# Patient Record
Sex: Female | Born: 1970 | Race: White | Hispanic: No | Marital: Married | Smoking: Former smoker
Health system: Southern US, Community
[De-identification: ages and names within clinical notes are randomized; demographics above are authoritative.]

## PROBLEM LIST (undated history)

## (undated) DIAGNOSIS — G4730 Sleep apnea, unspecified: Secondary | ICD-10-CM

## (undated) DIAGNOSIS — Z8742 Personal history of other diseases of the female genital tract: Secondary | ICD-10-CM

## (undated) DIAGNOSIS — Z8619 Personal history of other infectious and parasitic diseases: Secondary | ICD-10-CM

## (undated) DIAGNOSIS — G40909 Epilepsy, unspecified, not intractable, without status epilepticus: Secondary | ICD-10-CM

## (undated) DIAGNOSIS — G40209 Localization-related (focal) (partial) symptomatic epilepsy and epileptic syndromes with complex partial seizures, not intractable, without status epilepticus: Secondary | ICD-10-CM

## (undated) HISTORY — PX: ABDOMINAL HYSTERECTOMY: SHX81

---

## 2005-07-12 ENCOUNTER — Emergency Department: Payer: Self-pay | Admitting: Emergency Medicine

## 2005-07-13 ENCOUNTER — Ambulatory Visit: Payer: Self-pay | Admitting: Emergency Medicine

## 2005-07-21 ENCOUNTER — Ambulatory Visit: Payer: Self-pay | Admitting: Internal Medicine

## 2005-07-25 ENCOUNTER — Ambulatory Visit: Payer: Self-pay | Admitting: Internal Medicine

## 2006-04-30 HISTORY — PX: ABDOMINAL HYSTERECTOMY: SHX81

## 2007-04-09 ENCOUNTER — Ambulatory Visit: Payer: Self-pay | Admitting: Family Medicine

## 2007-08-05 ENCOUNTER — Ambulatory Visit: Payer: Self-pay | Admitting: Family Medicine

## 2008-02-26 ENCOUNTER — Ambulatory Visit: Payer: Self-pay | Admitting: Obstetrics and Gynecology

## 2008-08-31 ENCOUNTER — Ambulatory Visit: Payer: Self-pay | Admitting: Obstetrics and Gynecology

## 2008-09-06 ENCOUNTER — Ambulatory Visit: Payer: Self-pay | Admitting: Obstetrics and Gynecology

## 2009-03-03 ENCOUNTER — Ambulatory Visit: Payer: Self-pay | Admitting: Obstetrics and Gynecology

## 2010-04-20 ENCOUNTER — Ambulatory Visit: Payer: Self-pay | Admitting: Obstetrics and Gynecology

## 2010-10-12 ENCOUNTER — Ambulatory Visit: Payer: Self-pay | Admitting: Otolaryngology

## 2011-04-26 ENCOUNTER — Ambulatory Visit: Payer: Self-pay | Admitting: Obstetrics and Gynecology

## 2012-04-28 ENCOUNTER — Ambulatory Visit: Payer: Self-pay | Admitting: Obstetrics and Gynecology

## 2013-04-29 ENCOUNTER — Ambulatory Visit: Payer: Self-pay | Admitting: Obstetrics and Gynecology

## 2013-06-20 ENCOUNTER — Ambulatory Visit: Payer: Self-pay | Admitting: Physician Assistant

## 2013-06-20 LAB — RAPID INFLUENZA A&B ANTIGENS

## 2013-06-20 LAB — RAPID STREP-A WITH REFLX: Micro Text Report: NEGATIVE

## 2013-06-22 LAB — BETA STREP CULTURE(ARMC)

## 2014-03-06 ENCOUNTER — Ambulatory Visit: Payer: Self-pay | Admitting: Physician Assistant

## 2014-03-06 LAB — URINALYSIS, COMPLETE
Bilirubin,UR: NEGATIVE
Glucose,UR: NEGATIVE
NITRITE: POSITIVE
Ph: 6.5 (ref 5.0–8.0)
Protein: 30
RBC,UR: >30 /HPF (ref 0–5)
Specific Gravity: 1.02 (ref 1.000–1.030)

## 2014-03-08 LAB — URINE CULTURE

## 2014-03-13 ENCOUNTER — Ambulatory Visit: Payer: Self-pay | Admitting: Emergency Medicine

## 2014-03-13 LAB — URINALYSIS, COMPLETE
BILIRUBIN, UR: NEGATIVE
GLUCOSE, UR: NEGATIVE
Ketone: NEGATIVE
Nitrite: NEGATIVE
Ph: 7 (ref 5.0–8.0)
Protein: NEGATIVE
Specific Gravity: 1.01 (ref 1.000–1.030)
Squamous Epithelial: NONE SEEN

## 2014-03-15 LAB — URINE CULTURE

## 2014-04-12 ENCOUNTER — Ambulatory Visit: Payer: Self-pay | Admitting: Family Medicine

## 2014-11-30 ENCOUNTER — Other Ambulatory Visit: Payer: Self-pay | Admitting: Obstetrics and Gynecology

## 2014-11-30 DIAGNOSIS — Z1231 Encounter for screening mammogram for malignant neoplasm of breast: Secondary | ICD-10-CM

## 2014-12-16 ENCOUNTER — Other Ambulatory Visit: Payer: Self-pay | Admitting: Obstetrics and Gynecology

## 2014-12-28 ENCOUNTER — Telehealth: Payer: Self-pay

## 2014-12-28 DIAGNOSIS — N390 Urinary tract infection, site not specified: Secondary | ICD-10-CM

## 2014-12-28 NOTE — Telephone Encounter (Signed)
Pt pharmacy sent a refill request for nitrofurantion. Spoke with pt who stated she was with the understanding that she didn't need an appt for a year but could call for refills. Pt was put on medication for prophylaxis. Please advise.

## 2014-12-29 NOTE — Telephone Encounter (Signed)
It's okay to refill medication. She just needs to be seen once yearly or as needed for acute infections. Please send it in. Thank you

## 2014-12-30 MED ORDER — NITROFURANTOIN MONOHYD MACRO 100 MG PO CAPS: 100.0000 mg | ORAL_CAPSULE | Freq: Two times a day (BID) | ORAL | Status: DC

## 2014-12-30 NOTE — Telephone Encounter (Signed)
Medication was sent to pharmacy.

## 2015-01-27 ENCOUNTER — Encounter: Payer: Self-pay | Admitting: Emergency Medicine

## 2015-01-27 ENCOUNTER — Ambulatory Visit
Admission: EM | Admit: 2015-01-27 | Discharge: 2015-01-27 | Disposition: A | Discharge status: Home / Self Care | Payer: BLUE CROSS/BLUE SHIELD | Attending: Family Medicine | Admitting: Family Medicine

## 2015-01-27 DIAGNOSIS — J029 Acute pharyngitis, unspecified: Secondary | ICD-10-CM

## 2015-01-27 HISTORY — DX: Epilepsy, unspecified, not intractable, without status epilepticus: G40.909

## 2015-01-27 LAB — RAPID STREP SCREEN (MED CTR MEBANE ONLY): Streptococcus, Group A Screen (Direct): NEGATIVE

## 2015-01-27 MED ORDER — AZITHROMYCIN 250 MG PO TABS: ORAL_TABLET | ORAL | Status: AC

## 2015-01-27 MED ORDER — FEXOFENADINE-PSEUDOEPHED ER 180-240 MG PO TB24: 1.0000 | ORAL_TABLET | Freq: Every day | ORAL | Status: DC

## 2015-01-27 NOTE — Discharge Instructions (Signed)
Pharyngitis Pharyngitis is a sore throat (pharynx). There is redness, pain, and swelling of your throat. HOME CARE   Drink enough fluids to keep your pee (urine) clear or pale yellow.  Only take medicine as told by your doctor.  You may get sick again if you do not take medicine as told. Finish your medicines, even if you start to feel better.  Do not take aspirin.  Rest.  Rinse your mouth (gargle) with salt water ( tsp of salt per 1 qt of water) every 1-2 hours. This will help the pain.  If you are not at risk for choking, you can suck on hard candy or sore throat lozenges. GET HELP IF:  You have large, tender lumps on your neck.  You have a rash.  You cough up green, yellow-brown, or bloody spit. GET HELP RIGHT AWAY IF:   You have a stiff neck.  You drool or cannot swallow liquids.  You throw up (vomit) or are not able to keep medicine or liquids down.  You have very bad pain that does not go away with medicine.  You have problems breathing (not from a stuffy nose). MAKE SURE YOU:   Understand these instructions.  Will watch your condition.  Will get help right away if you are not doing well or get worse. Document Released: 10/03/2007 Document Revised: 02/04/2013 Document Reviewed: 12/22/2012 [facility] Patient Information 2015 [place], [place]. This information is not intended to replace advice given to you by your health care provider. Make sure you discuss any questions you have with your health care provider.  Salt Water Gargle This solution will help make your mouth and throat feel better. HOME CARE INSTRUCTIONS   Mix 1 teaspoon of salt in 8 ounces of warm water.  Gargle with this solution as much or often as you need or as directed. Swish and gargle gently if you have any sores or wounds in your mouth.  Do not swallow this mixture. Document Released: 01/19/2004 Document Revised: 07/09/2011 Document Reviewed: 06/11/2008 [facility] Patient Information 2015  [place], [place]. This information is not intended to replace advice given to you by your health care provider. Make sure you discuss any questions you have with your health care provider.

## 2015-01-27 NOTE — ED Notes (Signed)
Patient c/o sore throat for 3-4 days.  Patient reports stuffy nose and sinus congestion.

## 2015-01-27 NOTE — ED Provider Notes (Signed)
CSN: [id number]     Arrival date & time 01/27/15  7741 History   First MD Initiated Contact with Patient 01/27/15 [phone]     Chief Complaint  Patient presents with  . Sore Throat   (Consider location/radiation/quality/duration/timing/severity/associated sxs/prior Treatment) Patient is a 44 y.o. female presenting with pharyngitis. No language interpreter was used.  Sore Throat This is a new problem. The current episode started more than 2 days ago (Started on Monday). The problem occurs daily. The problem has been gradually worsening. Pertinent negatives include no chest pain, no abdominal pain, no headaches and no shortness of breath. The symptoms are aggravated by eating. Nothing relieves the symptoms. She has tried nothing for the symptoms.    Past Medical History  Diagnosis Date  . Epilepsy    Past Surgical History  Procedure Laterality Date  . Abdominal hysterectomy     Family History  Problem Relation Age of Onset  . Diabetes Mother   . Cancer Father    Social History  Substance Use Topics  . Smoking status: Former Research scientist (life sciences)  . Smokeless tobacco: Never Used  . Alcohol Use: Yes   OB History    No data available     Review of Systems  HENT: Positive for congestion and rhinorrhea. Negative for ear pain, facial swelling, hearing loss and sinus pressure.   Eyes: Negative for discharge.  Respiratory: Negative for cough, shortness of breath and wheezing.   Cardiovascular: Negative for chest pain and leg swelling.  Gastrointestinal: Negative for abdominal pain.  Neurological: Negative for headaches.  All other systems reviewed and are negative.   Allergies  Sulfa antibiotics  Home Medications   Prior to Admission medications   Medication Sig Start Date End Date Taking? Authorizing Provider  carbamazepine (CARBATROL) 300 MG 12 hr capsule Take 300 mg by mouth 2 (two) times daily.   Yes Historical Provider, MD  lamoTRIgine (LAMICTAL) 200 MG tablet Take 200 mg by mouth 2  (two) times daily.   Yes Historical Provider, MD  levETIRAcetam (KEPPRA) 750 MG tablet Take 3,000 mg by mouth at bedtime.   Yes Historical Provider, MD  Multiple Vitamin (MULTIVITAMIN) tablet Take 1 tablet by mouth daily.   Yes Historical Provider, MD  azithromycin (ZITHROMAX Z-PAK) 250 MG tablet Take 2 tablets first day and then 1 po a day for 4 days. May fill on Saturday, October 1 if not improving. 01/29/15 01/20/16  Frederich Cha, MD  fexofenadine-pseudoephedrine (ALLEGRA-D ALLERGY & CONGESTION) 180-240 MG 24 hr tablet Take 1 tablet by mouth daily. 01/27/15   Frederich Cha, MD  nitrofurantoin (MACRODANTIN) 100 MG capsule TAKE ONE CAPSULE BY MOUTH AS DIRECTED 01/11/15   Roda Shutters, FNP  nitrofurantoin, macrocrystal-monohydrate, (MACROBID) 100 MG capsule Take 1 capsule (100 mg total) by mouth every 12 (twelve) hours. 12/30/14   Roda Shutters, FNP   Meds Ordered and Administered this Visit  Medications - No data to display  BP 113/77 mmHg  Pulse 78  Temp(Src) 98.6 F (37 C) (Tympanic)  Resp 16  Ht 5\' 4"  (1.626 m)  Wt 145 lb (65.772 kg)  BMI 24.88 kg/m2  SpO2 100% No data found.   Physical Exam  Constitutional: She is oriented to person, place, and time. She appears well-developed and well-nourished.  HENT:  Head: Normocephalic and atraumatic.  Right Ear: Hearing, tympanic membrane, external ear and ear canal normal.  Left Ear: Hearing, tympanic membrane, external ear and ear canal normal.  Nose: Mucosal edema and rhinorrhea present. Right sinus exhibits  no maxillary sinus tenderness and no frontal sinus tenderness. Left sinus exhibits no maxillary sinus tenderness and no frontal sinus tenderness.  Mouth/Throat: Uvula is midline and mucous membranes are normal. Posterior oropharyngeal erythema present.  Eyes: Conjunctivae are normal. Pupils are equal, round, and reactive to light.  Neck: Normal range of motion. Neck supple. No tracheal deviation present. No thyromegaly present.   Musculoskeletal: Normal range of motion. She exhibits no edema.  Lymphadenopathy:    She has cervical adenopathy.  Neurological: She is alert and oriented to person, place, and time. No cranial nerve deficit.  Skin: Skin is warm and dry.  Psychiatric: She has a normal mood and affect. Her behavior is normal.  Vitals reviewed.  patient does not smoke nurse's notes were reviewed.  ED Course  Procedures (including critical care time)  Labs Review Labs Reviewed  RAPID STREP SCREEN (NOT AT [facility])  CULTURE, GROUP A STREP ([facility] ONLY)    Imaging Review No results found.   Visual Acuity Review  Right Eye Distance:   Left Eye Distance:   Bilateral Distance:    Right Eye Near:   Left Eye Near:    Bilateral Near:      Results for orders placed or performed during the hospital encounter of 01/27/15  Rapid strep screen  Result Value Ref Range   Streptococcus, Group A Screen (Direct) NEGATIVE NEGATIVE     MDM   1. Pharyngitis     Patient's been informed that the throat is red but no exudates seen on the tonsil beds. This been going on for about 4 days and I suggest to her that we'll place her on Allegra-D since this is probably more viral than anything else. I will give her a postdated prescription for Z-Pak to she can get filled on Saturday if she is not better at least since she's been waited 6 days to see if this is viral infection. Will give her work note for today as well. And tomorrow. I did inform her that strep culture is pending.      Frederich Cha, MD 01/27/15 [phone]

## 2015-01-29 LAB — CULTURE, GROUP A STREP (THRC)

## 2015-08-31 ENCOUNTER — Ambulatory Visit: Payer: Self-pay | Admitting: Urology

## 2015-12-29 ENCOUNTER — Other Ambulatory Visit: Payer: Self-pay | Admitting: Obstetrics and Gynecology

## 2015-12-29 DIAGNOSIS — Z1231 Encounter for screening mammogram for malignant neoplasm of breast: Secondary | ICD-10-CM

## 2016-01-26 ENCOUNTER — Ambulatory Visit
Admission: RE | Admit: 2016-01-26 | Discharge: 2016-01-26 | Disposition: A | Discharge status: Home / Self Care | Payer: BLUE CROSS/BLUE SHIELD | Source: Ambulatory Visit | Attending: Obstetrics and Gynecology | Admitting: Obstetrics and Gynecology

## 2016-01-26 ENCOUNTER — Other Ambulatory Visit: Payer: Self-pay | Admitting: Obstetrics and Gynecology

## 2016-01-26 DIAGNOSIS — Z1231 Encounter for screening mammogram for malignant neoplasm of breast: Secondary | ICD-10-CM

## 2016-11-14 ENCOUNTER — Other Ambulatory Visit: Payer: Self-pay | Admitting: Family Medicine

## 2016-11-14 DIAGNOSIS — N390 Urinary tract infection, site not specified: Secondary | ICD-10-CM

## 2017-01-24 ENCOUNTER — Other Ambulatory Visit: Payer: Self-pay | Admitting: Obstetrics and Gynecology

## 2017-01-24 DIAGNOSIS — Z1231 Encounter for screening mammogram for malignant neoplasm of breast: Secondary | ICD-10-CM

## 2017-02-13 ENCOUNTER — Ambulatory Visit
Admission: RE | Admit: 2017-02-13 | Discharge: 2017-02-13 | Disposition: A | Discharge status: Home / Self Care | Payer: 59 | Source: Ambulatory Visit | Attending: Obstetrics and Gynecology | Admitting: Obstetrics and Gynecology

## 2017-02-13 DIAGNOSIS — Z1231 Encounter for screening mammogram for malignant neoplasm of breast: Secondary | ICD-10-CM | POA: Diagnosis not present

## 2018-05-06 ENCOUNTER — Ambulatory Visit
Admission: EM | Admit: 2018-05-06 | Discharge: 2018-05-06 | Disposition: A | Discharge status: Home / Self Care | Payer: 59 | Attending: Family Medicine | Admitting: Family Medicine

## 2018-05-06 ENCOUNTER — Other Ambulatory Visit: Payer: Self-pay

## 2018-05-06 DIAGNOSIS — J069 Acute upper respiratory infection, unspecified: Secondary | ICD-10-CM | POA: Diagnosis not present

## 2018-05-06 DIAGNOSIS — J029 Acute pharyngitis, unspecified: Secondary | ICD-10-CM | POA: Insufficient documentation

## 2018-05-06 DIAGNOSIS — Z87891 Personal history of nicotine dependence: Secondary | ICD-10-CM

## 2018-05-06 DIAGNOSIS — B9789 Other viral agents as the cause of diseases classified elsewhere: Secondary | ICD-10-CM | POA: Diagnosis not present

## 2018-05-06 LAB — RAPID STREP SCREEN (MED CTR MEBANE ONLY): Streptococcus, Group A Screen (Direct): NEGATIVE

## 2018-05-06 MED ORDER — LIDOCAINE VISCOUS HCL 2 % MT SOLN: OROMUCOSAL | 0 refills | Status: DC

## 2018-05-06 NOTE — ED Triage Notes (Signed)
Patient complains of sore throat, drainage, painful swallowing x 3 days.

## 2018-05-06 NOTE — ED Provider Notes (Signed)
[facility]    CSN: [id number] Arrival date & time: 05/06/18  1050     History   Chief Complaint Chief Complaint  Patient presents with  . Sore Throat    APPT    HPI Pamela Douglas is a 48 y.o. female.   The history is provided by the patient.  Sore Throat   URI  Presenting symptoms: congestion and sore throat   Severity:  Moderate Onset quality:  Sudden Duration:  3 days Timing:  Constant Progression:  Unchanged Chronicity:  New Relieved by:  None tried Ineffective treatments:  None tried Associated symptoms: no wheezing   Risk factors: sick contacts   Risk factors: not elderly, no chronic cardiac disease, no chronic kidney disease, no chronic respiratory disease, no diabetes mellitus, no immunosuppression and no recent illness     Past Medical History:  Diagnosis Date  . Epilepsy ([place])     There are no active problems to display for this patient.   Past Surgical History:  Procedure Laterality Date  . ABDOMINAL HYSTERECTOMY      OB History   No obstetric history on file.      Home Medications    Prior to Admission medications   Medication Sig Start Date End Date Taking? Authorizing Provider  carbamazepine (CARBATROL) 300 MG 12 hr capsule Take 300 mg by mouth 2 (two) times daily.   Yes [provider]  lamoTRIgine (LAMICTAL) 200 MG tablet Take 200 mg by mouth 2 (two) times daily.   Yes [provider]  levETIRAcetam (KEPPRA) 750 MG tablet Take 3,000 mg by mouth at bedtime.   Yes [provider]  Multiple Vitamin (MULTIVITAMIN) tablet Take 1 tablet by mouth daily.   Yes [provider]  fexofenadine-pseudoephedrine (ALLEGRA-D ALLERGY & CONGESTION) 180-240 MG 24 hr tablet Take 1 tablet by mouth daily. 01/27/15   Frederich Cha, MD  lidocaine (XYLOCAINE) 2 % solution 20 ml gargle and spit q 6 hours prn 05/06/18   Norval Gable, MD  nitrofurantoin (MACRODANTIN) 100 MG capsule TAKE ONE CAPSULE BY MOUTH AS  DIRECTED 01/11/15   Roda Shutters, FNP  nitrofurantoin, macrocrystal-monohydrate, (MACROBID) 100 MG capsule Take 1 capsule (100 mg total) by mouth every 12 (twelve) hours. 12/30/14   Roda Shutters, FNP    Family History Family History  Problem Relation Age of Onset  . Diabetes Mother   . Breast cancer Mother 73  . Cancer Father     Social History Social History   Tobacco Use  . Smoking status: Former Research scientist (life sciences)  . Smokeless tobacco: Never Used  Substance Use Topics  . Alcohol use: Yes    Comment: occasionally  . Drug use: Not Currently     Allergies   Sulfa antibiotics   Review of Systems Review of Systems  HENT: Positive for congestion and sore throat.   Respiratory: Negative for wheezing.      Physical Exam Triage Vital Signs ED Triage Vitals  Enc Vitals Group     BP 05/06/18 1104 111/74     Pulse Rate 05/06/18 1104 93     Resp 05/06/18 1104 16     Temp 05/06/18 1104 98.5 F (36.9 C)     Temp Source 05/06/18 1104 Oral     SpO2 05/06/18 1104 100 %     Weight 05/06/18 1102 145 lb (65.8 kg)     Height 05/06/18 1102 5\' 3"  (1.6 m)     Head Circumference --  Peak Flow --      Pain Score 05/06/18 1102 6     Pain Loc --      Pain Edu? --      Excl. in [place]? --    No data found.  Updated Vital Signs BP 111/74 (BP Location: Left Arm)   Pulse 93   Temp 98.5 F (36.9 C) (Oral)   Resp 16   Ht 5\' 3"  (1.6 m)   Wt 65.8 kg   SpO2 100%   BMI 25.69 kg/m   Visual Acuity Right Eye Distance:   Left Eye Distance:   Bilateral Distance:    Right Eye Near:   Left Eye Near:    Bilateral Near:     Physical Exam Vitals signs and nursing note reviewed.  Constitutional:      General: She is not in acute distress.    Appearance: She is well-developed. She is not toxic-appearing or diaphoretic.  HENT:     Head: Normocephalic and atraumatic.     Right Ear: Tympanic membrane, ear canal and external ear normal.     Left Ear: Tympanic membrane, ear canal and  external ear normal.     Nose: Mucosal edema and rhinorrhea present. No nasal deformity, septal deviation or laceration.     Mouth/Throat:     Pharynx: Uvula midline. Posterior oropharyngeal erythema present. No pharyngeal swelling or oropharyngeal exudate.     Tonsils: No tonsillar exudate or tonsillar abscesses.  Eyes:     General: No scleral icterus.       Right eye: No discharge.        Left eye: No discharge.     Conjunctiva/sclera: Conjunctivae normal.  Neck:     Musculoskeletal: Normal range of motion and neck supple.     Thyroid: No thyromegaly.  Cardiovascular:     Rate and Rhythm: Normal rate and regular rhythm.     Heart sounds: Normal heart sounds.  Pulmonary:     Effort: Pulmonary effort is normal. No respiratory distress.     Breath sounds: Normal breath sounds. No stridor. No wheezing, rhonchi or rales.  Lymphadenopathy:     Cervical: No cervical adenopathy.  Neurological:     Mental Status: She is alert.      UC Treatments / Results  Labs (all labs ordered are listed, but only abnormal results are displayed) Labs Reviewed  RAPID STREP SCREEN ([facility] ONLY)  CULTURE, GROUP A STREP [facility])    EKG None  Radiology No results found.  Procedures Procedures (including critical care time)  Medications Ordered in UC Medications - No data to display  Initial Impression / Assessment and Plan / UC Course  I have reviewed the triage vital signs and the nursing notes.  Pertinent labs & imaging results that were available during my care of the patient were reviewed by me and considered in my medical decision making (see chart for details).      Final Clinical Impressions(s) / UC Diagnoses   Final diagnoses:  Viral pharyngitis  Viral URI with cough    ED Prescriptions    Medication Sig Dispense Auth. Provider   lidocaine (XYLOCAINE) 2 % solution 20 ml gargle and spit q 6 hours prn 100 mL Norval Gable, MD     1. Lab results and diagnosis  reviewed with patient 2. rx as per orders above; reviewed possible side effects, interactions, risks and benefits  3. Recommend supportive treatment with rest, fluids, otc meds 4. Follow-up prn if symptoms  worsen or don't improve   Controlled Substance Prescriptions [place] Controlled Substance Registry consulted? Not Applicable   Norval Gable, MD 05/06/18 1345

## 2018-05-08 LAB — CULTURE, GROUP A STREP (THRC)

## 2018-05-09 ENCOUNTER — Other Ambulatory Visit: Payer: Self-pay | Admitting: Obstetrics and Gynecology

## 2018-05-09 DIAGNOSIS — Z1231 Encounter for screening mammogram for malignant neoplasm of breast: Secondary | ICD-10-CM

## 2018-05-27 ENCOUNTER — Ambulatory Visit
Admission: RE | Admit: 2018-05-27 | Discharge: 2018-05-27 | Disposition: A | Discharge status: Home / Self Care | Payer: 59 | Source: Ambulatory Visit | Attending: Obstetrics and Gynecology | Admitting: Obstetrics and Gynecology

## 2018-05-27 DIAGNOSIS — Z1231 Encounter for screening mammogram for malignant neoplasm of breast: Secondary | ICD-10-CM | POA: Diagnosis present

## 2019-03-23 ENCOUNTER — Other Ambulatory Visit: Payer: Self-pay | Admitting: *Deleted

## 2019-03-23 DIAGNOSIS — Z20822 Contact with and (suspected) exposure to covid-19: Secondary | ICD-10-CM

## 2019-03-25 LAB — NOVEL CORONAVIRUS, NAA: SARS-CoV-2, NAA: DETECTED — AB

## 2019-07-23 ENCOUNTER — Other Ambulatory Visit: Payer: Self-pay | Admitting: Obstetrics and Gynecology

## 2019-07-23 DIAGNOSIS — Z1231 Encounter for screening mammogram for malignant neoplasm of breast: Secondary | ICD-10-CM

## 2019-07-28 ENCOUNTER — Ambulatory Visit: Payer: 59

## 2019-10-14 ENCOUNTER — Ambulatory Visit
Admission: RE | Admit: 2019-10-14 | Discharge: 2019-10-14 | Disposition: A | Discharge status: Home / Self Care | Payer: 59 | Source: Ambulatory Visit | Attending: Obstetrics and Gynecology | Admitting: Obstetrics and Gynecology

## 2019-10-14 ENCOUNTER — Other Ambulatory Visit: Payer: Self-pay

## 2019-10-14 DIAGNOSIS — Z1231 Encounter for screening mammogram for malignant neoplasm of breast: Secondary | ICD-10-CM | POA: Insufficient documentation

## 2019-10-21 ENCOUNTER — Other Ambulatory Visit: Payer: Self-pay | Admitting: Obstetrics and Gynecology

## 2019-10-21 DIAGNOSIS — R921 Mammographic calcification found on diagnostic imaging of breast: Secondary | ICD-10-CM

## 2019-10-21 DIAGNOSIS — R928 Other abnormal and inconclusive findings on diagnostic imaging of breast: Secondary | ICD-10-CM

## 2019-10-30 ENCOUNTER — Ambulatory Visit
Admission: RE | Admit: 2019-10-30 | Discharge: 2019-10-30 | Disposition: A | Discharge status: Home / Self Care | Payer: 59 | Source: Ambulatory Visit | Attending: Obstetrics and Gynecology | Admitting: Obstetrics and Gynecology

## 2019-10-30 DIAGNOSIS — R921 Mammographic calcification found on diagnostic imaging of breast: Secondary | ICD-10-CM | POA: Insufficient documentation

## 2019-10-30 DIAGNOSIS — R928 Other abnormal and inconclusive findings on diagnostic imaging of breast: Secondary | ICD-10-CM | POA: Diagnosis not present

## 2020-02-25 ENCOUNTER — Other Ambulatory Visit
Admission: RE | Admit: 2020-02-25 | Discharge: 2020-02-25 | Disposition: A | Discharge status: Home / Self Care | Payer: 59 | Source: Ambulatory Visit | Attending: Gastroenterology | Admitting: Gastroenterology

## 2020-02-25 ENCOUNTER — Other Ambulatory Visit: Payer: Self-pay

## 2020-02-25 DIAGNOSIS — Z01812 Encounter for preprocedural laboratory examination: Secondary | ICD-10-CM | POA: Insufficient documentation

## 2020-02-25 DIAGNOSIS — Z20822 Contact with and (suspected) exposure to covid-19: Secondary | ICD-10-CM | POA: Insufficient documentation

## 2020-02-26 LAB — SARS CORONAVIRUS 2 (TAT 6-24 HRS): SARS Coronavirus 2: NEGATIVE

## 2020-02-29 ENCOUNTER — Ambulatory Visit
Admission: RE | Admit: 2020-02-29 | Discharge: 2020-02-29 | Disposition: A | Discharge status: Home / Self Care | Payer: 59 | Attending: Gastroenterology | Admitting: Gastroenterology

## 2020-02-29 ENCOUNTER — Ambulatory Visit: Payer: 59 | Admitting: Anesthesiology

## 2020-02-29 ENCOUNTER — Encounter
Admission: RE | Disposition: A | Discharge status: Home / Self Care | Payer: Self-pay | Source: Home / Self Care | Attending: Gastroenterology

## 2020-02-29 ENCOUNTER — Encounter: Payer: Self-pay | Admitting: *Deleted

## 2020-02-29 DIAGNOSIS — Z882 Allergy status to sulfonamides status: Secondary | ICD-10-CM | POA: Insufficient documentation

## 2020-02-29 DIAGNOSIS — D120 Benign neoplasm of cecum: Secondary | ICD-10-CM | POA: Diagnosis not present

## 2020-02-29 DIAGNOSIS — Z90710 Acquired absence of both cervix and uterus: Secondary | ICD-10-CM | POA: Diagnosis not present

## 2020-02-29 DIAGNOSIS — Z79899 Other long term (current) drug therapy: Secondary | ICD-10-CM | POA: Insufficient documentation

## 2020-02-29 DIAGNOSIS — D123 Benign neoplasm of transverse colon: Secondary | ICD-10-CM | POA: Insufficient documentation

## 2020-02-29 DIAGNOSIS — Z1211 Encounter for screening for malignant neoplasm of colon: Secondary | ICD-10-CM | POA: Diagnosis present

## 2020-02-29 DIAGNOSIS — G40909 Epilepsy, unspecified, not intractable, without status epilepticus: Secondary | ICD-10-CM | POA: Insufficient documentation

## 2020-02-29 DIAGNOSIS — Z87891 Personal history of nicotine dependence: Secondary | ICD-10-CM | POA: Insufficient documentation

## 2020-02-29 DIAGNOSIS — K621 Rectal polyp: Secondary | ICD-10-CM | POA: Insufficient documentation

## 2020-02-29 HISTORY — DX: Localization-related (focal) (partial) symptomatic epilepsy and epileptic syndromes with complex partial seizures, not intractable, without status epilepticus: G40.209

## 2020-02-29 HISTORY — PX: COLONOSCOPY WITH PROPOFOL: SHX5780

## 2020-02-29 HISTORY — DX: Personal history of other diseases of the female genital tract: Z87.42

## 2020-02-29 HISTORY — DX: Personal history of other infectious and parasitic diseases: Z86.19

## 2020-02-29 SURGERY — COLONOSCOPY WITH PROPOFOL
Anesthesia: General

## 2020-02-29 MED ORDER — PROPOFOL 500 MG/50ML IV EMUL
INTRAVENOUS | Status: DC | PRN
  Administered 2020-02-29: 120 ug/kg/min via INTRAVENOUS

## 2020-02-29 MED ORDER — EPHEDRINE 5 MG/ML INJ
INTRAVENOUS | Status: AC
  Filled 2020-02-29: qty 10

## 2020-02-29 MED ORDER — MIDAZOLAM HCL 2 MG/2ML IJ SOLN
INTRAMUSCULAR | Status: AC
  Filled 2020-02-29: qty 2

## 2020-02-29 MED ORDER — EPHEDRINE SULFATE 50 MG/ML IJ SOLN
INTRAMUSCULAR | Status: DC | PRN
  Administered 2020-02-29: 10 mg via INTRAVENOUS
  Administered 2020-02-29: 5 mg via INTRAVENOUS

## 2020-02-29 MED ORDER — PROPOFOL 500 MG/50ML IV EMUL
INTRAVENOUS | Status: AC
  Filled 2020-02-29: qty 50

## 2020-02-29 MED ORDER — SODIUM CHLORIDE 0.9 % IV SOLN
INTRAVENOUS | Status: DC
  Administered 2020-02-29: 1000 mL via INTRAVENOUS

## 2020-02-29 MED ORDER — MIDAZOLAM HCL 2 MG/2ML IJ SOLN
INTRAMUSCULAR | Status: DC | PRN
  Administered 2020-02-29: 2 mg via INTRAVENOUS

## 2020-02-29 NOTE — Anesthesia Preprocedure Evaluation (Signed)
Anesthesia Evaluation  Patient identified by MRN, date of birth, ID band Patient awake    Reviewed: Allergy & Precautions, H&P , NPO status , Patient's Chart, lab work & pertinent test results, reviewed documented beta blocker date and time   History of Anesthesia Complications Negative for: history of anesthetic complications  Airway Mallampati: III  TM Distance: >3 FB Neck ROM: full    Dental  (+) Dental Advidsory Given, Teeth Intact, Caps Permanent bridge on the lower right:   Pulmonary neg pulmonary ROS, former smoker,    Pulmonary exam normal breath sounds clear to auscultation       Cardiovascular Exercise Tolerance: Good negative cardio ROS Normal cardiovascular exam Rhythm:regular Rate:Normal     Neuro/Psych Seizures -,  negative psych ROS   GI/Hepatic negative GI ROS, Neg liver ROS,   Endo/Other  negative endocrine ROS  Renal/GU negative Renal ROS  negative genitourinary   Musculoskeletal   Abdominal   Peds  Hematology negative hematology ROS (+)   Anesthesia Other Findings Past Medical History: No date: Complex partial seizure disorder (HCC) No date: Epilepsy ([place]) No date: History of abnormal cervical Pap smear No date: History of chlamydia   Reproductive/Obstetrics negative OB ROS                             Anesthesia Physical Anesthesia Plan  ASA: II  Anesthesia Plan: General   Post-op Pain Management:    Induction: Intravenous  PONV Risk Score and Plan: 3 and Propofol infusion and TIVA  Airway Management Planned: Natural Airway and Nasal Cannula  Additional Equipment:   Intra-op Plan:   Post-operative Plan:   Informed Consent: I have reviewed the patients History and Physical, chart, labs and discussed the procedure including the risks, benefits and alternatives for the proposed anesthesia with the patient or authorized representative who has indicated  his/her understanding and acceptance.     Dental Advisory Given  Plan Discussed with: Anesthesiologist, CRNA and Surgeon  Anesthesia Plan Comments:         Anesthesia Quick Evaluation

## 2020-02-29 NOTE — Anesthesia Postprocedure Evaluation (Signed)
Anesthesia Post Note  Patient: Shavonne Ambroise Sarratt  Procedure(s) Performed: COLONOSCOPY WITH PROPOFOL (N/A )  Patient location during evaluation: Endoscopy Anesthesia Type: General Level of consciousness: awake and alert and oriented Pain management: pain level controlled Vital Signs Assessment: post-procedure vital signs reviewed and stable Respiratory status: spontaneous breathing, nonlabored ventilation and respiratory function stable Cardiovascular status: blood pressure returned to baseline and stable Postop Assessment: no signs of nausea or vomiting Anesthetic complications: no   No complications documented.   Last Vitals:  Vitals:   02/29/20 1029 02/29/20 1049  BP: 103/63 105/64  Pulse:    Resp:    Temp:    SpO2:      Last Pain:  Vitals:   02/29/20 1049  TempSrc:   PainSc: 0-No pain                 Madeline Pho

## 2020-02-29 NOTE — H&P (Signed)
Outpatient short stay form Pre-procedure 02/29/2020 9:36 AM Raylene Miyamoto MD, MPH  Primary Physician: Dr. Leafy Ro  Reason for visit:  Screening  History of present illness:   49 y/o lady with seizure disorder here for screening colonoscopy. History of vaginal hysterectomy. No family history of GI malignancies. No blood thinners. No new symptoms.    Current Facility-Administered Medications:  .  0.9 %  sodium chloride infusion, , Intravenous, Continuous, Rondarius Kadrmas, Hilton Cork, MD  Medications Prior to Admission  Medication Sig Dispense Refill Last Dose  . lamoTRIgine (LAMICTAL) 200 MG tablet Take 200 mg by mouth 2 (two) times daily.   02/29/2020 at 0630  . levETIRAcetam (KEPPRA) 750 MG tablet Take 3,000 mg by mouth at bedtime.   02/28/2020 at Unknown time  . Multiple Vitamin (MULTIVITAMIN) tablet Take 1 tablet by mouth daily.   02/28/2020 at Unknown time  . carbamazepine (CARBATROL) 300 MG 12 hr capsule Take 300 mg by mouth 2 (two) times daily.   0600  . fexofenadine-pseudoephedrine (ALLEGRA-D ALLERGY & CONGESTION) 180-240 MG 24 hr tablet Take 1 tablet by mouth daily. (Patient not taking: Reported on 02/29/2020) 30 tablet 0 Not Taking at Unknown time  . lidocaine (XYLOCAINE) 2 % solution 20 ml gargle and spit q 6 hours prn 100 mL 0   . nitrofurantoin (MACRODANTIN) 100 MG capsule TAKE ONE CAPSULE BY MOUTH AS DIRECTED (Patient not taking: Reported on 02/29/2020) 14 capsule 3 Completed Course at Unknown time  . nitrofurantoin, macrocrystal-monohydrate, (MACROBID) 100 MG capsule Take 1 capsule (100 mg total) by mouth every 12 (twelve) hours. (Patient not taking: Reported on 02/29/2020) 14 capsule 3 Completed Course at Unknown time     Allergies  Allergen Reactions  . Sulfa Antibiotics Rash     Past Medical History:  Diagnosis Date  . Complex partial seizure disorder ([place])   . Epilepsy ([place])   . History of abnormal cervical Pap smear   . History of chlamydia     Review of systems:   Otherwise negative.    Physical Exam  Gen: Alert, oriented. Appears stated age.  HEENT: [place]/AT. PERRLA. Lungs: No respiratory distress CV: RRR Abd: soft, benign, no masses. Ext: No edema.     Planned procedures: Proceed with colonoscopy. The patient understands the nature of the planned procedure, indications, risks, alternatives and potential complications including but not limited to bleeding, infection, perforation, damage to internal organs and possible oversedation/side effects from anesthesia. The patient agrees and gives consent to proceed.  Please refer to procedure notes for findings, recommendations and patient disposition/instructions.     Raylene Miyamoto MD, MPH Gastroenterology 02/29/2020  9:36 AM

## 2020-02-29 NOTE — Anesthesia Procedure Notes (Signed)
Performed by: Cook-Martin, Briant Angelillo Pre-anesthesia Checklist: Patient identified, Emergency Drugs available, Suction available, Patient being monitored and Timeout performed Patient Re-evaluated:Patient Re-evaluated prior to induction Oxygen Delivery Method: Nasal cannula Preoxygenation: Pre-oxygenation with 100% oxygen Induction Type: IV induction Placement Confirmation: positive ETCO2 and CO2 detector       

## 2020-02-29 NOTE — Transfer of Care (Signed)
Immediate Anesthesia Transfer of Care Note  Patient: Pamela Douglas  Procedure(s) Performed: COLONOSCOPY WITH PROPOFOL (N/A )  Patient Location: PACU  Anesthesia Type:General  Level of Consciousness: awake and sedated  Airway & Oxygen Therapy: Patient Spontanous Breathing and Patient connected to nasal cannula oxygen  Post-op Assessment: Report given to RN and Post -op Vital signs reviewed and stable  Post vital signs: Reviewed and stable  Last Vitals:  Vitals Value Taken Time  BP    Temp    Pulse    Resp    SpO2      Last Pain:  Vitals:   02/29/20 0922  TempSrc: Temporal  PainSc: 0-No pain         Complications: No complications documented.

## 2020-02-29 NOTE — Op Note (Signed)
[facility] Patient Name: Pamela Douglas Procedure Date: 02/29/2020 9:53 AM MRN: [id number] Account #: [id number] Date of Birth: [birth year] Admit Type: Outpatient Age: 49 Room: [facility] ENDO ROOM 3 Gender: Female Note Status: Finalized Procedure:             Colonoscopy Indications:           Screening for colorectal malignant neoplasm Providers:             Andrey Farmer MD, MD Referring MD:          Angelina Pih (Referring MD) Medicines:             Monitored Anesthesia Care Complications:         No immediate complications. Estimated blood loss:                         Minimal. Procedure:             Pre-Anesthesia Assessment:                        - Prior to the procedure, a History and Physical was                         performed, and patient medications and allergies were                         reviewed. The patient is competent. The risks and                         benefits of the procedure and the sedation options and                         risks were discussed with the patient. All questions                         were answered and informed consent was obtained.                         Patient identification and proposed procedure were                         verified by the physician, the nurse, the anesthetist                         and the technician in the endoscopy suite. Mental                         Status Examination: alert and oriented. Airway                         Examination: normal oropharyngeal airway and neck                         mobility. Respiratory Examination: clear to                         auscultation. CV Examination: normal. Prophylactic  Antibiotics: The patient does not require prophylactic                         antibiotics. Prior Anticoagulants: The patient has                         taken no previous anticoagulant or antiplatelet                         agents. ASA  Grade Assessment: II - A patient with mild                         systemic disease. After reviewing the risks and                         benefits, the patient was deemed in satisfactory                         condition to undergo the procedure. The anesthesia                         plan was to use monitored anesthesia care (MAC).                         Immediately prior to administration of medications,                         the patient was re-assessed for adequacy to receive                         sedatives. The heart rate, respiratory rate, oxygen                         saturations, blood pressure, adequacy of pulmonary                         ventilation, and response to care were monitored                         throughout the procedure. The physical status of the                         patient was re-assessed after the procedure.                        After obtaining informed consent, the colonoscope was                         passed under direct vision. Throughout the procedure,                         the patient's blood pressure, pulse, and oxygen                         saturations were monitored continuously. The                         Colonoscope was introduced through the anus and  advanced to the the cecum, identified by appendiceal                         orifice and ileocecal valve. The colonoscopy was                         performed without difficulty. The patient tolerated                         the procedure well. The quality of the bowel                         preparation was good. Findings:      The perianal and digital rectal examinations were normal.      A 1 mm polyp was found in the cecum. The polyp was sessile. The polyp       was removed with a jumbo cold forceps. Resection and retrieval were       complete. Estimated blood loss was minimal.      A localized area of mildly nodular mucosa was found in the cecum.        Appeared almost as inverted diverticulum vs prominent fold. About 3 mm       in size. Biopsies were taken with a cold forceps for histology.       Estimated blood loss was minimal.      Two sessile polyps were found in the transverse colon. The polyps were 1       to 2 mm in size. These polyps were removed with a jumbo cold forceps.       Resection and retrieval were complete. Estimated blood loss was minimal.      A 2 mm polyp was found in the rectum. The polyp was sessile. The polyp       was removed with a jumbo cold forceps. Resection and retrieval were       complete. Estimated blood loss was minimal.      The exam was otherwise without abnormality on direct and retroflexion       views. Impression:            - One 1 mm polyp in the cecum, removed with a jumbo                         cold forceps. Resected and retrieved.                        - Nodular mucosa in the cecum. Biopsied.                        - Two 1 to 2 mm polyps in the transverse colon,                         removed with a jumbo cold forceps. Resected and                         retrieved.                        - One 2 mm polyp in the rectum, removed with a jumbo  cold forceps. Resected and retrieved.                        - The examination was otherwise normal on direct and                         retroflexion views. Recommendation:        - Discharge patient to home.                        - Resume previous diet.                        - Continue present medications.                        - Await pathology results. If area of nodular mucosa                         in cecum is adenomatous will need repeat colonoscopy                         for removal.                        - Repeat colonoscopy for surveillance based on                         pathology results.                        - Return to referring physician as previously                         scheduled. Procedure Code(s):      --- Professional ---                        [phone], Colonoscopy, flexible; with biopsy, single or                         multiple Diagnosis Code(s):     --- Professional ---                        Z12.11, Encounter for screening for malignant neoplasm                         of colon                        K63.5, Polyp of colon                        K62.1, Rectal polyp                        K63.89, Other specified diseases of intestine CPT copyright 2019 American Medical Association. All rights reserved. The codes documented in this report are preliminary and upon coder review may  be revised to meet current compliance requirements. Andrey Farmer, MD Andrey Farmer MD, MD 02/29/2020 10:20:58 AM Number of Addenda: 0 Note Initiated On: 02/29/2020 9:53 AM Scope Withdrawal Time: 0 hours 13 minutes  37 seconds  Total Procedure Duration: 0 hours 16 minutes 56 seconds  Estimated Blood Loss:  Estimated blood loss was minimal.      [facility]

## 2020-02-29 NOTE — Interval H&P Note (Signed)
History and Physical Interval Note:  02/29/2020 9:38 AM  Pamela Douglas  has presented today for surgery, with the diagnosis of colon cancer screening.  The various methods of treatment have been discussed with the patient and family. After consideration of risks, benefits and other options for treatment, the patient has consented to  Procedure(s): COLONOSCOPY WITH PROPOFOL (N/A) as a surgical intervention.  The patient's history has been reviewed, patient examined, no change in status, stable for surgery.  I have reviewed the patient's chart and labs.  Questions were answered to the patient's satisfaction.     Lesly Rubenstein  Ok to proceed with colonoscopy.

## 2020-03-01 ENCOUNTER — Encounter: Payer: Self-pay | Admitting: Gastroenterology

## 2020-03-01 LAB — SURGICAL PATHOLOGY

## 2020-05-12 ENCOUNTER — Other Ambulatory Visit: Payer: Self-pay

## 2020-05-12 ENCOUNTER — Other Ambulatory Visit
Admission: RE | Admit: 2020-05-12 | Discharge: 2020-05-12 | Disposition: A | Discharge status: Home / Self Care | Payer: 59 | Source: Ambulatory Visit | Attending: Gastroenterology | Admitting: Gastroenterology

## 2020-05-12 DIAGNOSIS — Z01812 Encounter for preprocedural laboratory examination: Secondary | ICD-10-CM | POA: Insufficient documentation

## 2020-05-12 DIAGNOSIS — Z20822 Contact with and (suspected) exposure to covid-19: Secondary | ICD-10-CM | POA: Insufficient documentation

## 2020-05-12 LAB — SARS CORONAVIRUS 2 (TAT 6-24 HRS): SARS Coronavirus 2: NEGATIVE

## 2020-05-13 ENCOUNTER — Encounter: Payer: Self-pay | Admitting: *Deleted

## 2020-05-13 ENCOUNTER — Other Ambulatory Visit: Payer: 59

## 2020-05-16 ENCOUNTER — Encounter: Admission: RE | Payer: Self-pay | Source: Home / Self Care

## 2020-05-16 ENCOUNTER — Ambulatory Visit: Admission: RE | Admit: 2020-05-16 | Payer: 59 | Source: Home / Self Care

## 2020-05-16 SURGERY — COLONOSCOPY WITH PROPOFOL
Anesthesia: General

## 2020-05-23 ENCOUNTER — Other Ambulatory Visit: Payer: Self-pay

## 2020-05-23 ENCOUNTER — Ambulatory Visit
Admission: EM | Admit: 2020-05-23 | Discharge: 2020-05-23 | Disposition: A | Discharge status: Home / Self Care | Payer: 59 | Attending: Emergency Medicine | Admitting: Emergency Medicine

## 2020-05-23 DIAGNOSIS — J069 Acute upper respiratory infection, unspecified: Secondary | ICD-10-CM | POA: Diagnosis not present

## 2020-05-23 DIAGNOSIS — U071 COVID-19: Secondary | ICD-10-CM | POA: Insufficient documentation

## 2020-05-23 MED ORDER — BENZONATATE 100 MG PO CAPS: 200.0000 mg | ORAL_CAPSULE | Freq: Three times a day (TID) | ORAL | 0 refills | Status: DC

## 2020-05-23 MED ORDER — PROMETHAZINE-DM 6.25-15 MG/5ML PO SYRP: 5.0000 mL | ORAL_SOLUTION | Freq: Four times a day (QID) | ORAL | 0 refills | Status: DC | PRN

## 2020-05-23 NOTE — ED Triage Notes (Signed)
Pt presents with feeling of being run down, HA, fever, scratchy throat, cough starting yesterday.  Negative at home test.  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 Took BC powder and Zofran at 0830.  Zofran hasn't helped with nausea but she feels it has prevented her from vomiting.

## 2020-05-23 NOTE — ED Provider Notes (Signed)
[facility]    CSN: [id number] Arrival date & time: 05/23/20  1146      History   Chief Complaint Chief Complaint  Patient presents with  . Sore Throat    HPI Pamela Douglas is a 50 y.o. female.   HPI   50 year old female here for evaluation of fatigue, headache, fever, sore throat, and cough that started yesterday.  Patient reports that she did take a home test that was negative.  Patient has had some mild nausea but it resolved with some Zofran.  Patient reports that her T-max was 100.5.  She reports that her cough has been productive and she has had associated shortness of breath.  She denies runny nose but states that her nasal passages feel really irritated.  Patient denies ear pain or pressure, wheezing, vomiting diarrhea, or known Covid exposure.  Patient has had Covid, been fully vaccinated gets Covid, and has received her flu shot.  Past Medical History:  Diagnosis Date  . 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 Epilepsy ([place])   . History of abnormal cervical Pap smear   . History of chlamydia     There are no problems to display for this patient.   Past Surgical History:  Procedure Laterality Date  . ABDOMINAL HYSTERECTOMY  2008  . COLONOSCOPY WITH PROPOFOL N/A 02/29/2020   Procedure: COLONOSCOPY WITH PROPOFOL;  Surgeon: Lesly Rubenstein, MD;  Location: [facility] ENDOSCOPY;  Service: Endoscopy;  Laterality: N/A;    OB History   No obstetric history on file.      Home Medications    Prior to Admission medications   Medication Sig Start Date End Date Taking? Authorizing Provider  benzonatate (TESSALON) 100 MG capsule Take 2 capsules (200 mg total) by mouth every 8 (eight) hours. 05/23/20  Yes Margarette Canada, NP  carbamazepine (CARBATROL) 300 MG 12 hr capsule Take 300 mg by mouth 2 (two) times daily.   Yes [provider]  lamoTRIgine (LAMICTAL) 200 MG tablet Take 200 mg by mouth 2 (two) times daily.   Yes [provider]   levETIRAcetam (KEPPRA) 750 MG tablet Take 3,000 mg by mouth at bedtime.   Yes [provider]  Multiple Vitamin (MULTIVITAMIN) tablet Take 1 tablet by mouth daily.   Yes [provider]  promethazine-dextromethorphan (PROMETHAZINE-DM) 6.25-15 MG/5ML syrup Take 5 mLs by mouth 4 (four) times daily as needed. 05/23/20  Yes Margarette Canada, NP  fexofenadine-pseudoephedrine (ALLEGRA-D ALLERGY & CONGESTION) 180-240 MG 24 hr tablet Take 1 tablet by mouth daily. Patient not taking: Reported on 02/29/2020 01/27/15   Frederich Cha, MD    Family History Family History  Problem Relation Age of Onset  . Diabetes Mother   . Breast cancer Mother 76  . Cancer Father   . Lung cancer Father   . Diabetes Maternal Aunt   . Diabetes Maternal Uncle   . Diabetes Maternal Grandmother     Social History Social History   Tobacco Use  . Smoking status: Former Research scientist (life sciences)  . Smokeless tobacco: Never Used  Vaping Use  . Vaping Use: Never used  Substance Use Topics  . Alcohol use: Yes    Comment: occasionally  . Drug use: Not Currently     Allergies   Sulfa antibiotics   Review of Systems Review of Systems  Constitutional: Positive for fatigue and fever. Negative for activity change and appetite change.  HENT: Positive for congestion and sore throat. Negative for ear pain and rhinorrhea.  Respiratory: Positive for cough and shortness of breath. Negative for wheezing.   Gastrointestinal: Positive for nausea. Negative for diarrhea and vomiting.  Musculoskeletal: Negative for arthralgias and myalgias.  Skin: Negative for rash.  Neurological: Positive for headaches.  Hematological: Negative.   Psychiatric/Behavioral: Negative.      Physical Exam Triage Vital Signs ED Triage Vitals  Enc Vitals Group     BP 05/23/20 1209 99/75     Pulse Rate 05/23/20 1207 (!) 101     Resp 05/23/20 1207 18     Temp 05/23/20 1207 99.5 F (37.5 C)     Temp Source 05/23/20 1207 Oral     SpO2 05/23/20  1207 98 %     Weight --      Height --      Head Circumference --      Peak Flow --      Pain Score 05/23/20 1204 3     Pain Loc --      Pain Edu? --      Excl. in [place]? --    No data found.  Updated Vital Signs BP 99/75 (BP Location: Right Arm) Comment: pt states hypotension not abnormal for her  Pulse (!) 101   Temp 99.5 F (37.5 C) (Oral)   Resp 18   SpO2 98%   Visual Acuity Right Eye Distance:   Left Eye Distance:   Bilateral Distance:    Right Eye Near:   Left Eye Near:    Bilateral Near:     Physical Exam Vitals and nursing note reviewed.  Constitutional:      General: She is not in acute distress.    Appearance: Normal appearance. She is normal weight. She is not toxic-appearing.  HENT:     Head: Normocephalic and atraumatic.     Right Ear: Tympanic membrane, ear canal and external ear normal.     Left Ear: Tympanic membrane, ear canal and external ear normal.     Nose: Congestion present.     Comments: Nasal mucosa is erythematous and edematous without nasal discharge.    Mouth/Throat:     Mouth: Mucous membranes are moist.     Pharynx: Oropharynx is clear. No oropharyngeal exudate or posterior oropharyngeal erythema.  Cardiovascular:     Rate and Rhythm: Normal rate and regular rhythm.     Pulses: Normal pulses.     Heart sounds: Normal heart sounds. No murmur heard. No gallop.   Pulmonary:     Effort: Pulmonary effort is normal.     Breath sounds: Normal breath sounds. No wheezing, rhonchi or rales.  Skin:    General: Skin is warm and dry.     Capillary Refill: Capillary refill takes less than 2 seconds.     Findings: No erythema or rash.  Neurological:     General: No focal deficit present.     Mental Status: She is alert and oriented to person, place, and time.  Psychiatric:        Mood and Affect: Mood normal.        Behavior: Behavior normal.        Thought Content: Thought content normal.        Judgment: Judgment normal.      UC  Treatments / Results  Labs (all labs ordered are listed, but only abnormal results are displayed) Labs Reviewed  SARS CORONAVIRUS 2 (TAT 6-24 HRS)    EKG   Radiology No results found.  Procedures Procedures (including critical care time)  Medications Ordered in UC Medications - No data to display  Initial Impression / Assessment and Plan / UC Course  I have reviewed the triage vital signs and the nursing notes.  Pertinent labs & imaging results that were available during my care of the patient were reviewed by me and considered in my medical decision making (see chart for details).   Patient is a very lovely 50 year old female here for evaluation of Covid-like symptoms that began yesterday.  Patient is nontoxic in appearance and is not in any acute distress.  Physical exam reveals erythematous and edematous nasal mucosa without nasal discharge.  Patient's posterior oropharynx is free of erythema, edema, injection, or exudate.  Patient's lungs are clear to auscultation all fields.  Will swab patient for Covid and discharge home to isolate pending the results.  Will give Tessalon Perles and Promethazine DM cough syrup for cough and congestion.  Patient given ER precautions.   Final Clinical Impressions(s) / UC Diagnoses   Final diagnoses:  Viral URI with cough     Discharge Instructions     Isolate at home to the results of your Covid test are back.  If you test positive then you will need to quarantine for 5 days from the onset of your symptoms.  After 5 days you can break quarantine if your symptoms have improved you have not had a fever for 24 hours.  Use over-the-counter ibuprofen as needed for fever and pain.  You may continue to use Zofran as needed for nausea.  Use the Tessalon Perles during the day as needed for cough and the Promethazine DM cough syrup at bedtime for cough and congestion as it will make you drowsy.  If you develop worsening shortness of  breath-especially at rest, you are unable to speak in full sentences, or is a late sign your lips are turning blue you need go the ER for evaluation.    ED Prescriptions    Medication Sig Dispense Auth. Provider   benzonatate (TESSALON) 100 MG capsule Take 2 capsules (200 mg total) by mouth every 8 (eight) hours. 21 capsule Margarette Canada, NP   promethazine-dextromethorphan (PROMETHAZINE-DM) 6.25-15 MG/5ML syrup Take 5 mLs by mouth 4 (four) times daily as needed. 118 mL Margarette Canada, NP     PDMP not reviewed this encounter.   Margarette Canada, NP 05/23/20 1244

## 2020-05-23 NOTE — Discharge Instructions (Addendum)
Isolate at home to the results of your Covid test are back.  If you test positive then you will need to quarantine for 5 days from the onset of your symptoms.  After 5 days you can break quarantine if your symptoms have improved you have not had a fever for 24 hours.  Use over-the-counter ibuprofen as needed for fever and pain.  You may continue to use Zofran as needed for nausea.  Use the Tessalon Perles during the day as needed for cough and the Promethazine DM cough syrup at bedtime for cough and congestion as it will make you drowsy.  If you develop worsening shortness of breath-especially at rest, you are unable to speak in full sentences, or is a late sign your lips are turning blue you need go the ER for evaluation.

## 2020-05-24 LAB — SARS CORONAVIRUS 2 (TAT 6-24 HRS): SARS Coronavirus 2: POSITIVE — AB

## 2020-06-22 IMAGING — MG DIGITAL SCREENING BILAT W/ TOMO W/ CAD
8 series · 8 of 24 positions shown · non-contrast
Comparison: Previous exams.

CLINICAL DATA: Screening.

EXAM:
DIGITAL SCREENING BILATERAL MAMMOGRAM WITH TOMO AND CAD

[R CC synth-2D]
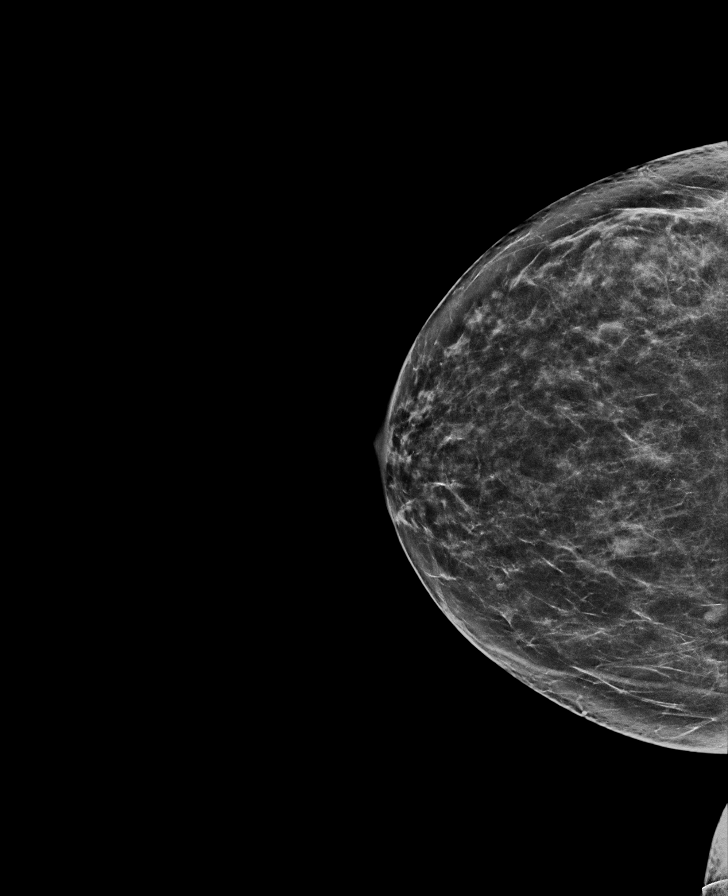

[L CC synth-2D]
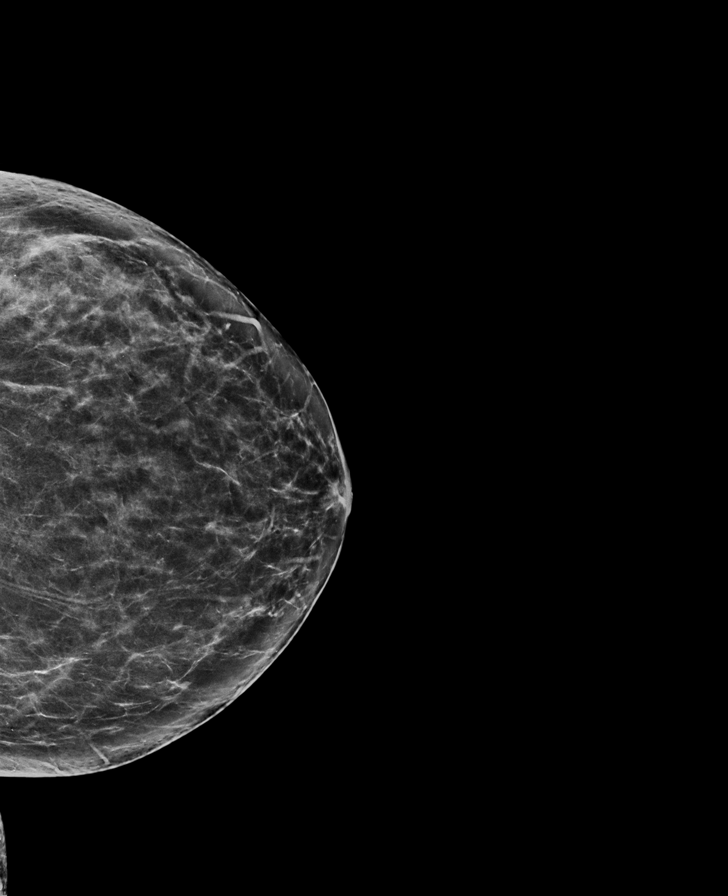

[R MLO synth-2D]
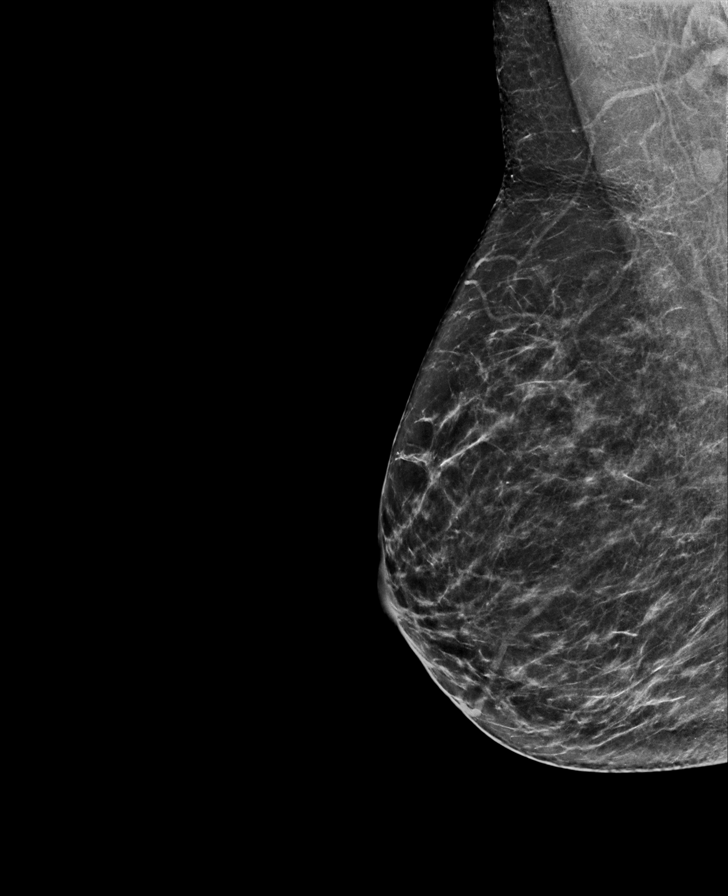

[L MLO synth-2D]
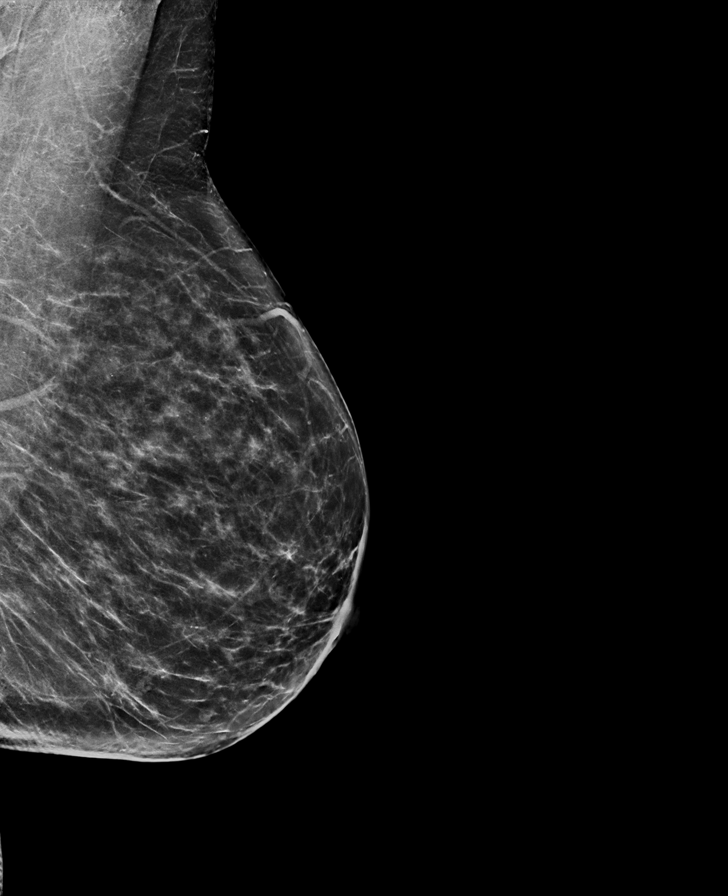

[L MLO tomo · tomo slice 37/74.0]
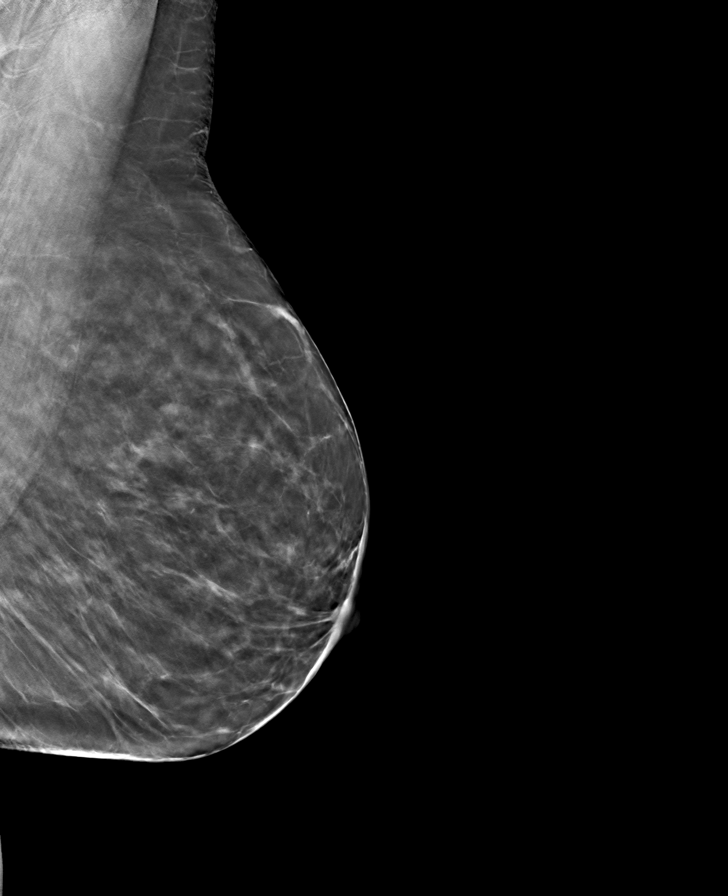

[R CC tomo · tomo slice 34/67.0]
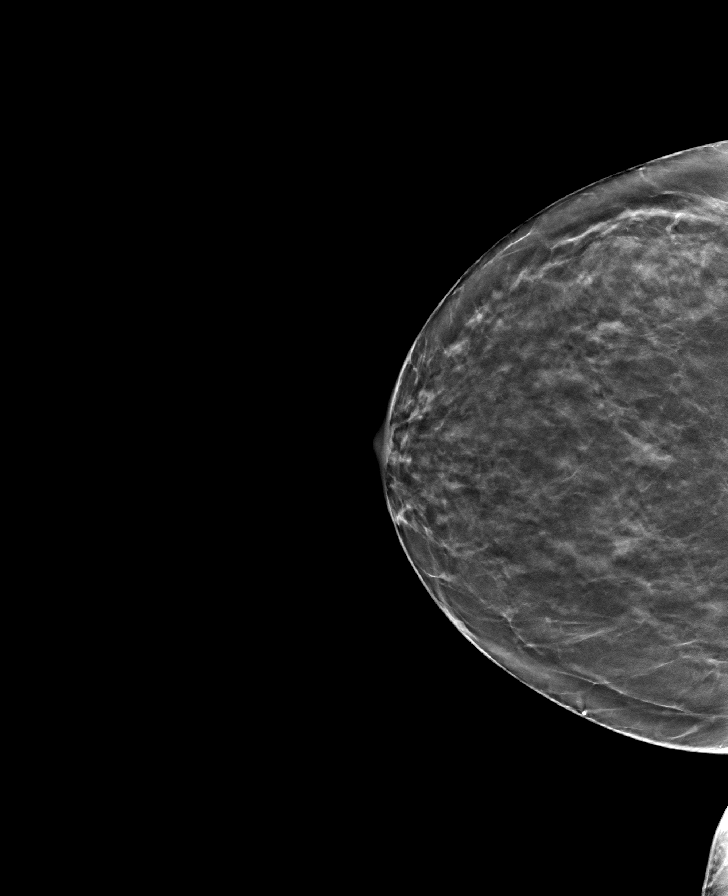

[R MLO tomo · tomo slice 36/71.0]
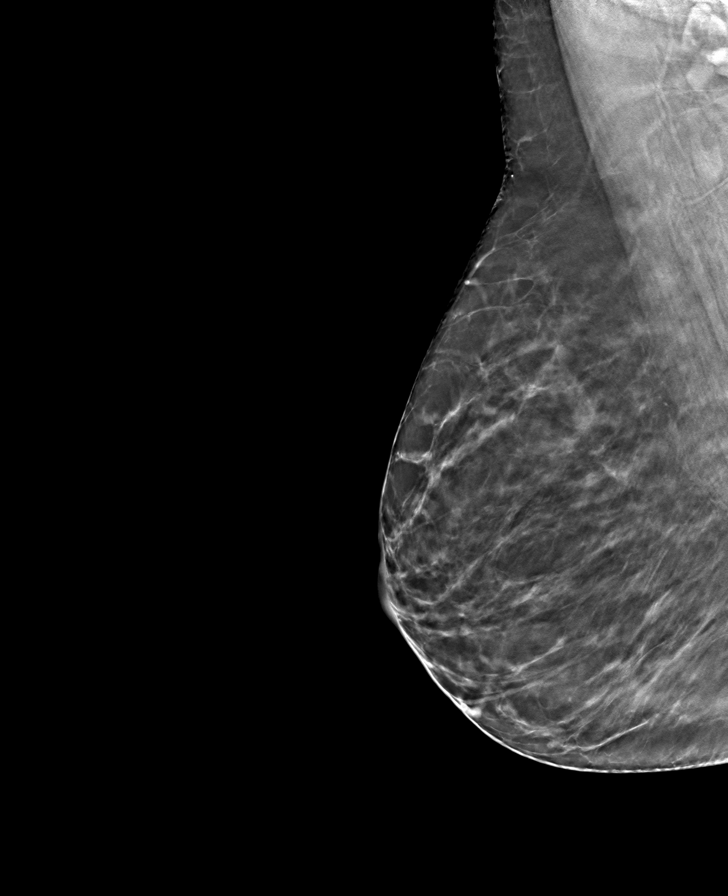

[L CC tomo · tomo slice 37/72.0]
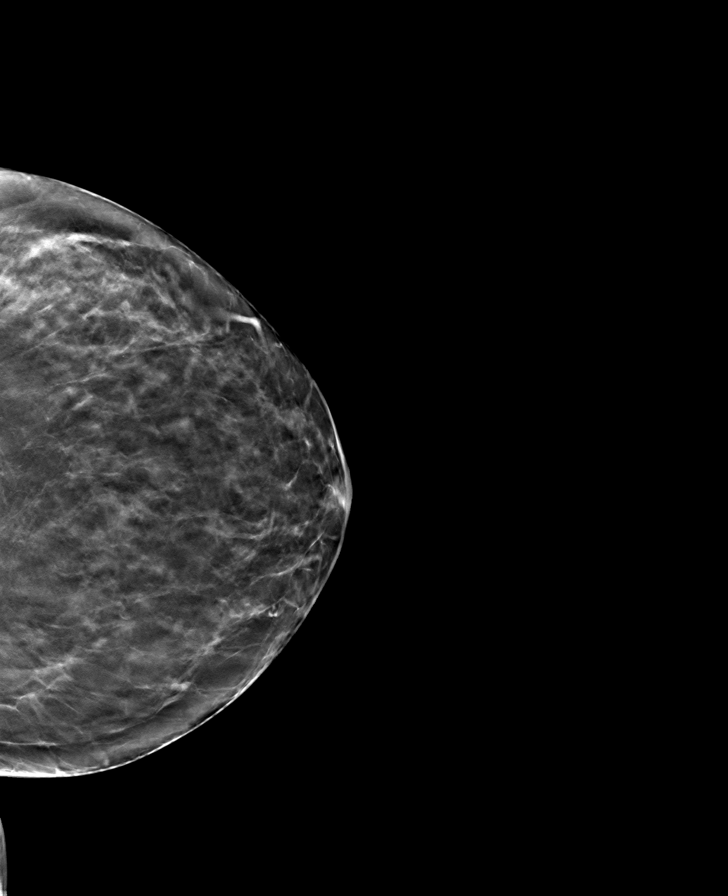

[8 of 24 positions shown; findings below may reference images not displayed]

ACR Breast Density Category c: The breast tissue is heterogeneously
dense, which may obscure small masses.
FINDINGS: In the right breast, calcifications warrant further evaluation with
magnified views. In the left breast, no findings suspicious for
malignancy. Images were processed with CAD.
IMPRESSION: Further evaluation is suggested for calcifications in the right
breast.

RECOMMENDATION:
Diagnostic mammogram of the right breast. (Code:DV-[id number])

The patient will be contacted regarding the findings, and additional
imaging will be scheduled.

BI-RADS CATEGORY  0: Incomplete. Need additional imaging evaluation
and/or prior mammograms for comparison.

## 2020-07-08 IMAGING — MG DIGITAL DIAGNOSTIC UNILAT RIGHT W/ CAD
3 series · 3 of 3 positions shown · non-contrast
Comparison: Previous exam(s).

CLINICAL DATA: Recall from screening mammography with
tomosynthesis, calcifications involving the LOWER RIGHT breast at
POSTERIOR depth.

EXAM:
DIGITAL DIAGNOSTIC RIGHT MAMMOGRAM

[R CC]
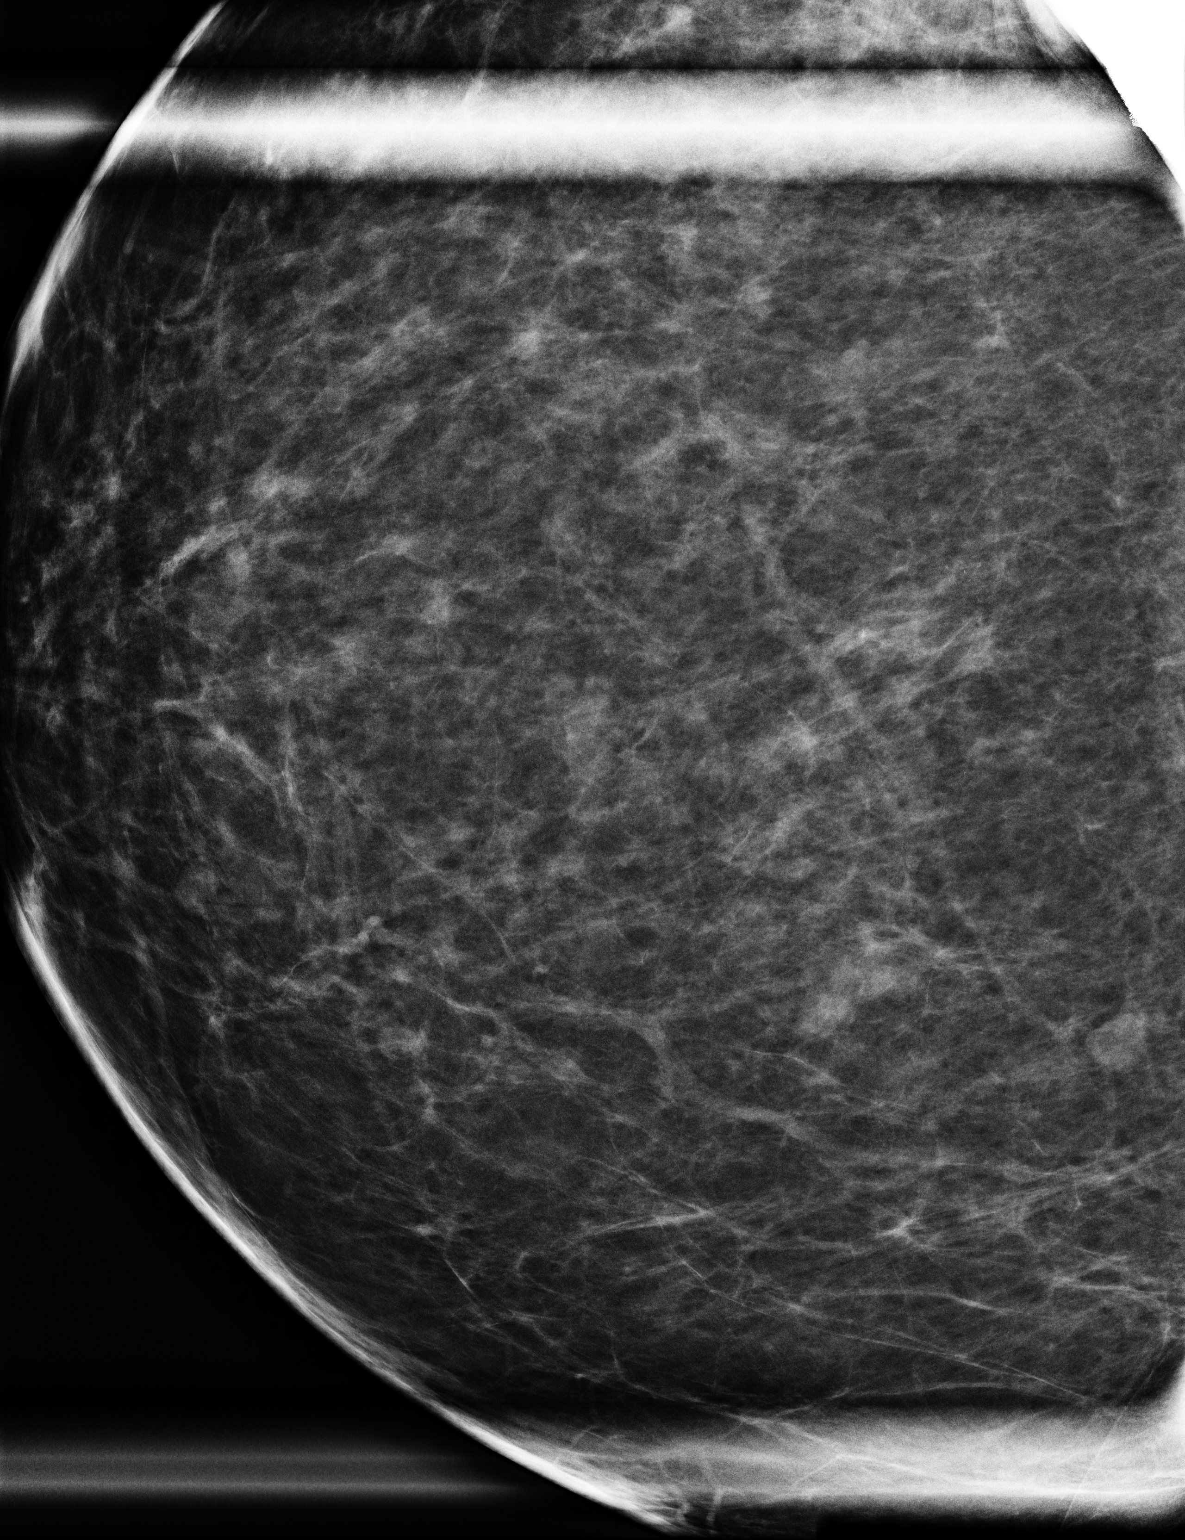

[R ML (1 of 2)]
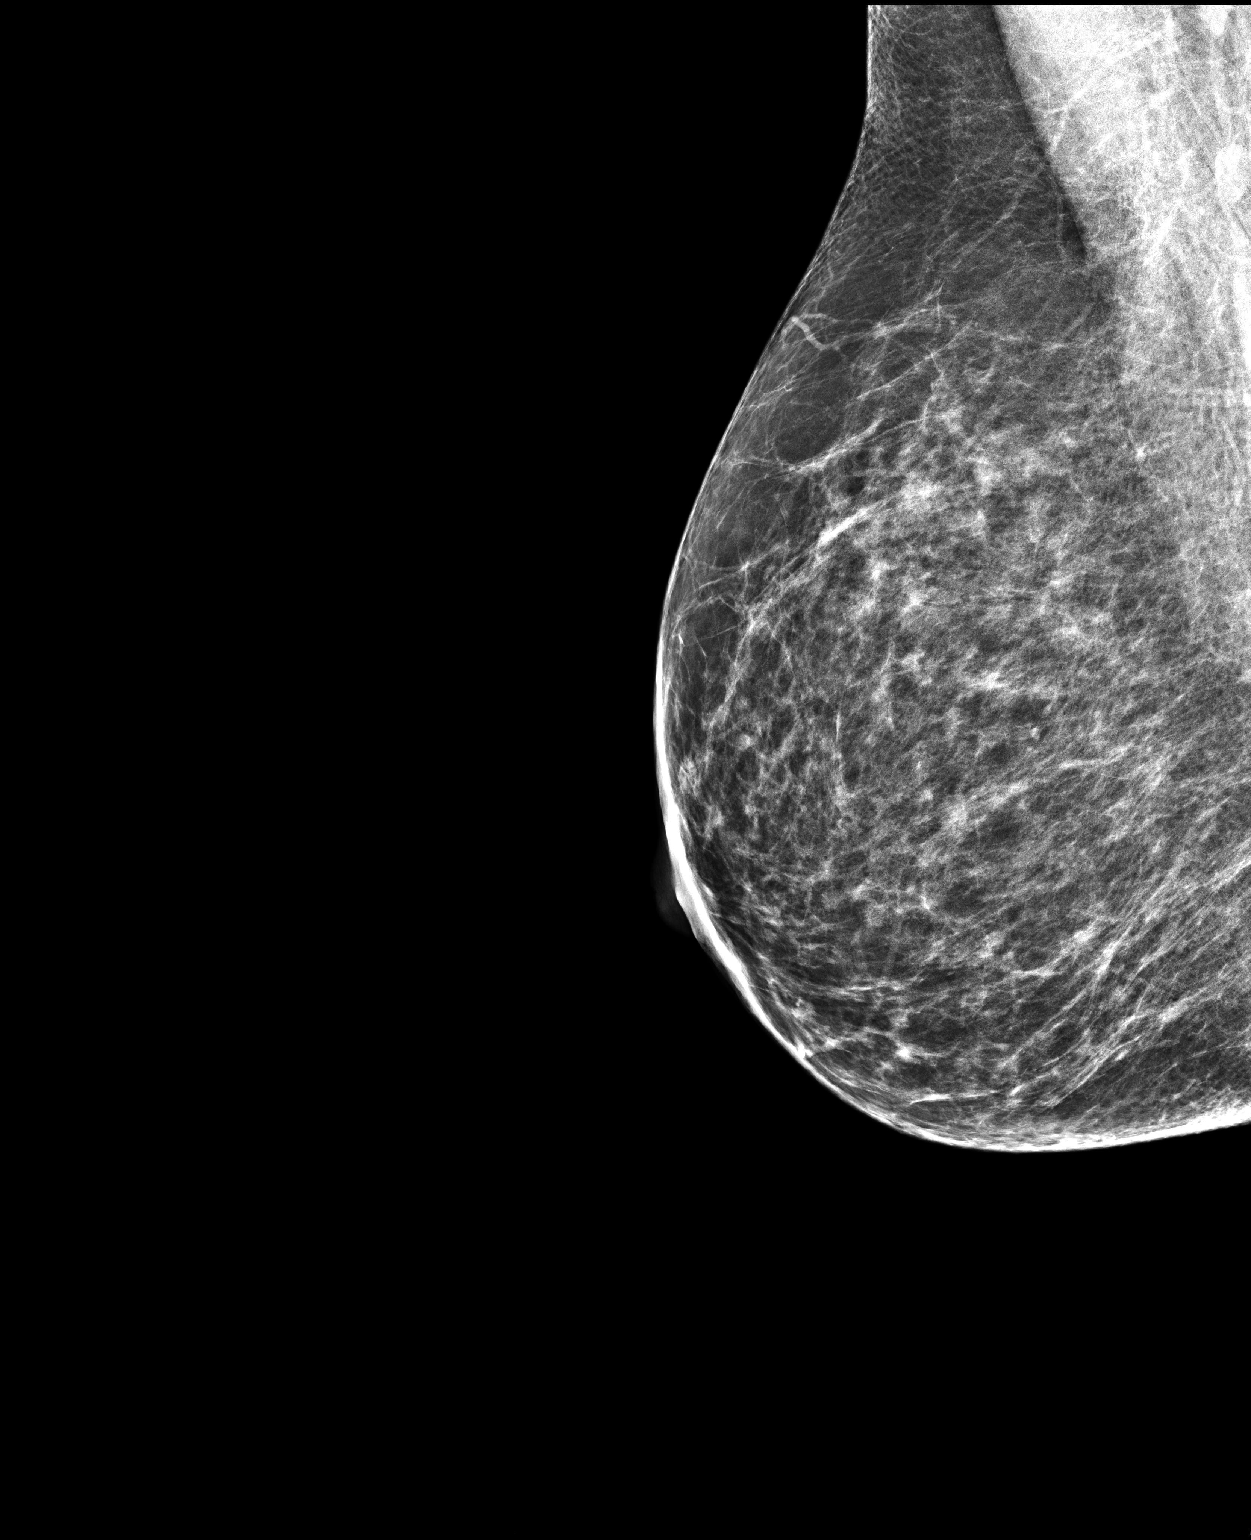

[R ML (2 of 2)]
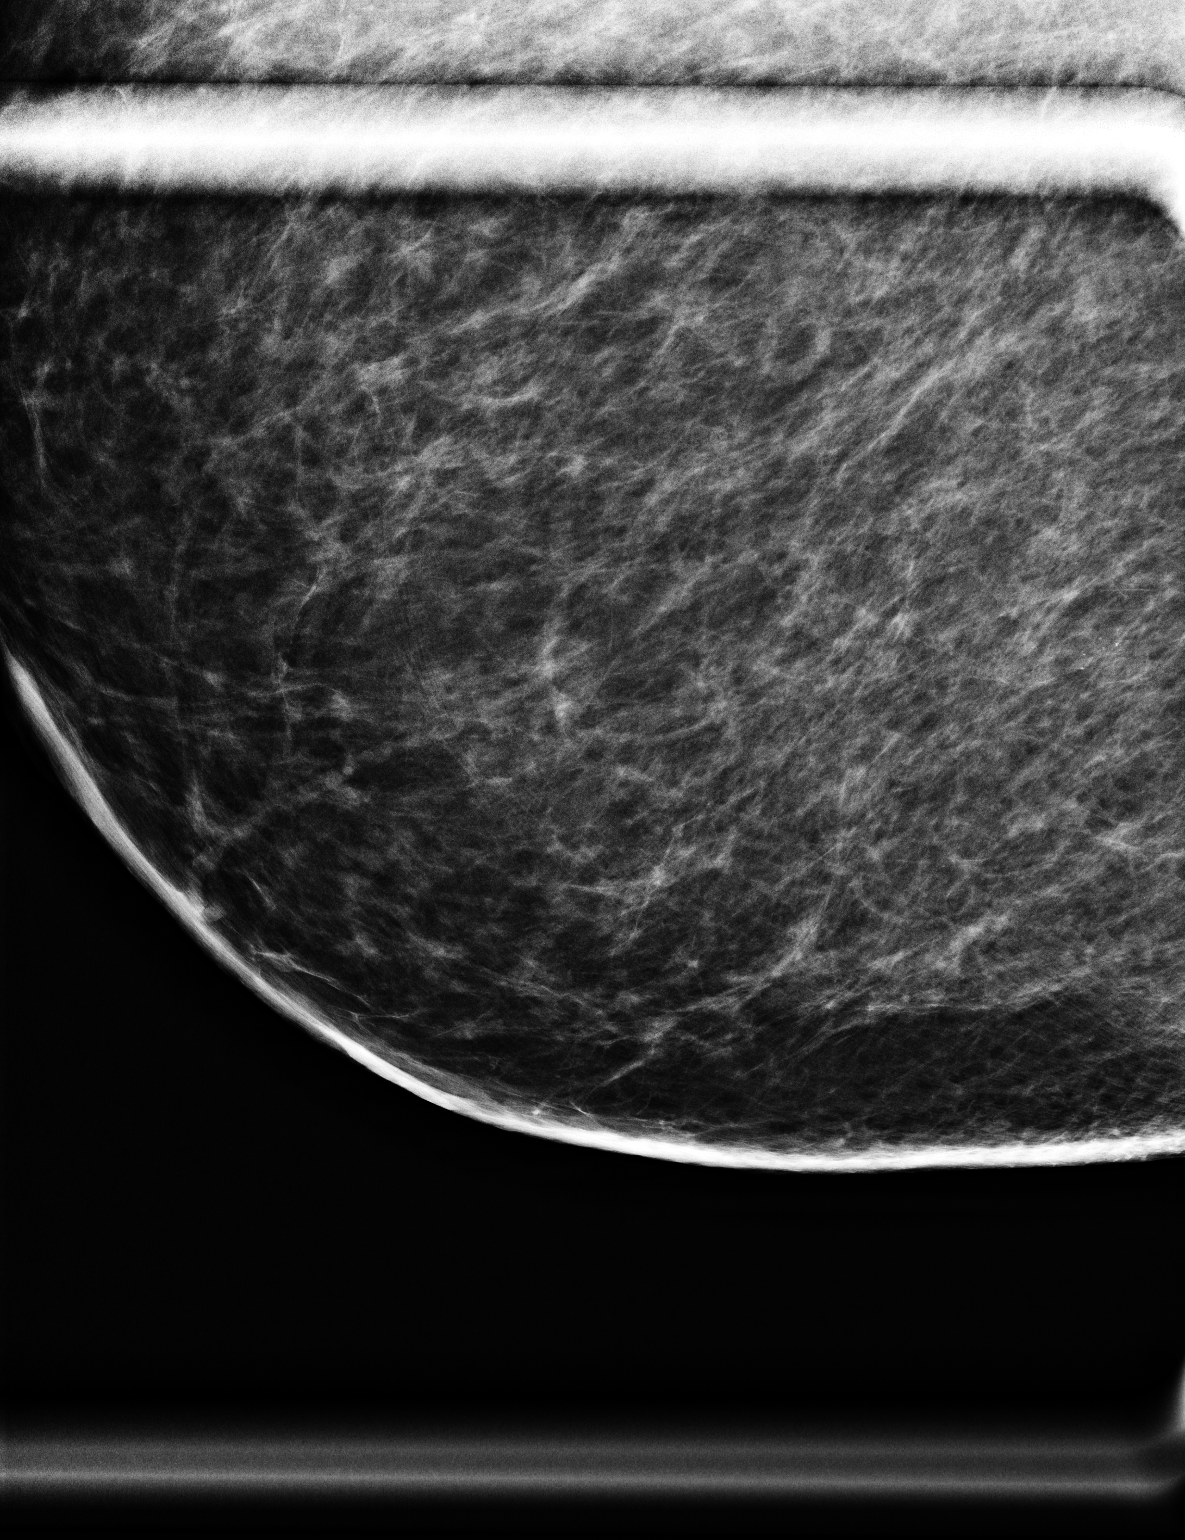

[3 of 3 positions shown; findings below may reference images not displayed]

ACR Breast Density Category b: There are scattered areas of
fibroglandular density.
FINDINGS: Standard spot magnification CC and mediolateral views of the RIGHT
breast calcifications and a standard 2D full field mediolateral view
of the RIGHT breast were obtained.

There is a 4 mm group of faint punctate calcifications in the LOWER
breast at POSTERIOR depth. There are no suspicious linear or
branching forms.
IMPRESSION: Likely benign 4 mm group of calcifications involving the LOWER RIGHT
breast at POSTERIOR depth.

RECOMMENDATION:
Diagnostic RIGHT mammogram and spot magnification views of the RIGHT
breast calcifications in 6 months.

I have discussed the findings and recommendations with the patient.
If applicable, a reminder letter will be sent to the patient
regarding the next appointment.

BI-RADS CATEGORY  3: Probably benign.

## 2020-07-21 ENCOUNTER — Other Ambulatory Visit: Admission: RE | Admit: 2020-07-21 | Payer: 59 | Source: Ambulatory Visit

## 2020-07-21 NOTE — Progress Notes (Signed)
Arrived to the covid testing site, was positive on 05/23/20. Per policy, she does not need to get repeat covid test prior to surgery. Call to the endoscopy unit to verify also.

## 2020-07-22 ENCOUNTER — Encounter: Payer: Self-pay | Admitting: *Deleted

## 2020-07-25 ENCOUNTER — Encounter
Admission: RE | Disposition: A | Discharge status: Home / Self Care | Payer: Self-pay | Source: Home / Self Care | Attending: Gastroenterology

## 2020-07-25 ENCOUNTER — Ambulatory Visit: Payer: 59 | Admitting: Registered Nurse

## 2020-07-25 ENCOUNTER — Encounter: Payer: Self-pay | Admitting: *Deleted

## 2020-07-25 ENCOUNTER — Other Ambulatory Visit: Payer: Self-pay

## 2020-07-25 ENCOUNTER — Ambulatory Visit
Admission: RE | Admit: 2020-07-25 | Discharge: 2020-07-25 | Disposition: A | Discharge status: Home / Self Care | Payer: 59 | Attending: Gastroenterology | Admitting: Gastroenterology

## 2020-07-25 DIAGNOSIS — Z79899 Other long term (current) drug therapy: Secondary | ICD-10-CM | POA: Insufficient documentation

## 2020-07-25 DIAGNOSIS — D120 Benign neoplasm of cecum: Secondary | ICD-10-CM | POA: Insufficient documentation

## 2020-07-25 DIAGNOSIS — G40909 Epilepsy, unspecified, not intractable, without status epilepticus: Secondary | ICD-10-CM | POA: Insufficient documentation

## 2020-07-25 DIAGNOSIS — K621 Rectal polyp: Secondary | ICD-10-CM | POA: Diagnosis not present

## 2020-07-25 DIAGNOSIS — D126 Benign neoplasm of colon, unspecified: Secondary | ICD-10-CM | POA: Diagnosis present

## 2020-07-25 DIAGNOSIS — Z882 Allergy status to sulfonamides status: Secondary | ICD-10-CM | POA: Diagnosis not present

## 2020-07-25 HISTORY — DX: Sleep apnea, unspecified: G47.30

## 2020-07-25 HISTORY — PX: COLONOSCOPY WITH PROPOFOL: SHX5780

## 2020-07-25 SURGERY — COLONOSCOPY WITH PROPOFOL
Anesthesia: General

## 2020-07-25 MED ORDER — PROPOFOL 10 MG/ML IV BOLUS
INTRAVENOUS | Status: DC | PRN
  Administered 2020-07-25: 70 mg via INTRAVENOUS
  Administered 2020-07-25: 30 mg via INTRAVENOUS

## 2020-07-25 MED ORDER — LIDOCAINE HCL (CARDIAC) PF 100 MG/5ML IV SOSY
PREFILLED_SYRINGE | INTRAVENOUS | Status: DC | PRN
  Administered 2020-07-25: 100 mg via INTRAVENOUS

## 2020-07-25 MED ORDER — PROPOFOL 500 MG/50ML IV EMUL
INTRAVENOUS | Status: DC | PRN
  Administered 2020-07-25: 150 ug/kg/min via INTRAVENOUS

## 2020-07-25 MED ORDER — SODIUM CHLORIDE 0.9 % IV SOLN: INTRAVENOUS | Status: DC

## 2020-07-25 NOTE — Op Note (Signed)
[facility] Gastroenterology Patient Name: Pamela Douglas Procedure Date: 07/25/2020 8:29 AM MRN: [id number] Account #: [id number] Date of Birth: 29-Jan-1971 Admit Type: Outpatient Age: 50 Room: [facility] ENDO ROOM 3 Gender: Female Note Status: Finalized Procedure:             Colonoscopy Indications:           Adenomatous polyps in the colon Providers:             Andrey Farmer MD, MD Medicines:             Monitored Anesthesia Care Complications:         No immediate complications. Estimated blood loss:                         Minimal. Procedure:             Pre-Anesthesia Assessment:                        - Prior to the procedure, a History and Physical was                         performed, and patient medications and allergies were                         reviewed. The patient is competent. The risks and                         benefits of the procedure and the sedation options and                         risks were discussed with the patient. All questions                         were answered and informed consent was obtained.                         Patient identification and proposed procedure were                         verified by the physician, the nurse, the anesthetist                         and the technician in the endoscopy suite. Mental                         Status Examination: alert and oriented. Airway                         Examination: normal oropharyngeal airway and neck                         mobility. Respiratory Examination: clear to                         auscultation. CV Examination: normal. Prophylactic                         Antibiotics: The patient does not require prophylactic  antibiotics. Prior Anticoagulants: The patient has                         taken no previous anticoagulant or antiplatelet                         agents. ASA Grade Assessment: II - A patient with mild                          systemic disease. After reviewing the risks and                         benefits, the patient was deemed in satisfactory                         condition to undergo the procedure. The anesthesia                         plan was to use monitored anesthesia care (MAC).                         Immediately prior to administration of medications,                         the patient was re-assessed for adequacy to receive                         sedatives. The heart rate, respiratory rate, oxygen                         saturations, blood pressure, adequacy of pulmonary                         ventilation, and response to care were monitored                         throughout the procedure. The physical status of the                         patient was re-assessed after the procedure.                        After obtaining informed consent, the colonoscope was                         passed under direct vision. Throughout the procedure,                         the patient's blood pressure, pulse, and oxygen                         saturations were monitored continuously. The                         Colonoscope was introduced through the anus and                         advanced to the the cecum, identified by appendiceal  orifice and ileocecal valve. The colonoscopy was                         performed without difficulty. The patient tolerated                         the procedure well. The quality of the bowel                         preparation was excellent. Findings:      The perianal and digital rectal examinations were normal.      A 3 mm polyp was found in the cecum. The polyp was sessile. The polyp       was removed with a cold snare. Resection and retrieval were complete.       Estimated blood loss was minimal.      A 2 mm polyp was found in the rectum. The polyp was hyperplastic. The       polyp was removed with a jumbo cold forceps. Resection and retrieval        were complete. Estimated blood loss was minimal.      The exam was otherwise without abnormality on direct and retroflexion       views. Impression:            - One 3 mm polyp in the cecum, removed with a cold                         snare. Resected and retrieved.                        - One 2 mm polyp in the rectum, removed with a jumbo                         cold forceps. Resected and retrieved.                        - The examination was otherwise normal on direct and                         retroflexion views. Recommendation:        - Discharge patient to home.                        - Resume previous diet.                        - Continue present medications.                        - Await pathology results.                        - Repeat colonoscopy in 5 years for surveillance.                        - Return to referring physician as previously                         scheduled. Procedure Code(s):     --- Professional ---  45385, Colonoscopy, flexible; with removal of                         tumor(s), polyp(s), or other lesion(s) by snare                         technique                        45380, 59, Colonoscopy, flexible; with biopsy, single                         or multiple Diagnosis Code(s):     --- Professional ---                        K63.5, Polyp of colon                        K62.1, Rectal polyp                        D12.6, Benign neoplasm of colon, unspecified CPT copyright 2019 American Medical Association. All rights reserved. The codes documented in this report are preliminary and upon coder review may  be revised to meet current compliance requirements. Andrey Farmer MD, MD 07/25/2020 8:54:25 AM Number of Addenda: 0 Note Initiated On: 07/25/2020 8:29 AM Scope Withdrawal Time: 0 hours 8 minutes 36 seconds  Total Procedure Duration: 0 hours 11 minutes 10 seconds  Estimated Blood Loss:  Estimated blood loss was minimal.       [facility]

## 2020-07-25 NOTE — Anesthesia Preprocedure Evaluation (Signed)
Anesthesia Evaluation  Patient identified by MRN, date of birth, ID band Patient awake    Reviewed: Allergy & Precautions, NPO status , Patient's Chart, lab work & pertinent test results  History of Anesthesia Complications Negative for: history of anesthetic complications  Airway Mallampati: III       Dental   Pulmonary sleep apnea and Continuous Positive Airway Pressure Ventilation , neg COPD, former smoker,           Cardiovascular (-) hypertension(-) Past MI and (-) CHF (-) dysrhythmias (-) Valvular Problems/Murmurs     Neuro/Psych Seizures - (1 time in last year and a half), Well Controlled,     GI/Hepatic Neg liver ROS, neg GERD  ,  Endo/Other  neg diabetes  Renal/GU negative Renal ROS     Musculoskeletal   Abdominal   Peds  Hematology   Anesthesia Other Findings   Reproductive/Obstetrics                             Anesthesia Physical Anesthesia Plan  ASA: III  Anesthesia Plan: General   Post-op Pain Management:    Induction: Intravenous  PONV Risk Score and Plan: 3 and Propofol infusion and TIVA  Airway Management Planned: Nasal Cannula  Additional Equipment:   Intra-op Plan:   Post-operative Plan:   Informed Consent: I have reviewed the patients History and Physical, chart, labs and discussed the procedure including the risks, benefits and alternatives for the proposed anesthesia with the patient or authorized representative who has indicated his/her understanding and acceptance.       Plan Discussed with:   Anesthesia Plan Comments:         Anesthesia Quick Evaluation

## 2020-07-25 NOTE — Transfer of Care (Signed)
Immediate Anesthesia Transfer of Care Note  Patient: Pamela Douglas  Procedure(s) Performed: COLONOSCOPY WITH PROPOFOL (N/A )  Patient Location: Endoscopy Unit  Anesthesia Type:General  Level of Consciousness: drowsy  Airway & Oxygen Therapy: Patient Spontanous Breathing  Post-op Assessment: Report given to RN and Post -op Vital signs reviewed and stable  Post vital signs: Reviewed and stable  Last Vitals:  Vitals Value Taken Time  BP 95/57 07/25/20 0852  Temp 36.2 C 07/25/20 0851  Pulse 75 07/25/20 0853  Resp 23 07/25/20 0853  SpO2 100 % 07/25/20 0853  Vitals shown include unvalidated device data.  Last Pain:  Vitals:   07/25/20 0851  TempSrc: Temporal  PainSc: Asleep         Complications: No complications documented.

## 2020-07-25 NOTE — Anesthesia Postprocedure Evaluation (Signed)
Anesthesia Post Note  Patient: Pamela Douglas  Procedure(s) Performed: COLONOSCOPY WITH PROPOFOL (N/A )  Patient location during evaluation: Endoscopy Anesthesia Type: General Level of consciousness: awake and alert Pain management: pain level controlled Vital Signs Assessment: post-procedure vital signs reviewed and stable Respiratory status: spontaneous breathing and respiratory function stable Cardiovascular status: stable Anesthetic complications: no   No complications documented.   Last Vitals:  Vitals:   07/25/20 0901 07/25/20 0911  BP: 104/65 132/86  Pulse: 64 65  Resp: 15 13  Temp:    SpO2: 100% 100%    Last Pain:  Vitals:   07/25/20 0901  TempSrc:   PainSc: 0-No pain                 [name]

## 2020-07-25 NOTE — Interval H&P Note (Signed)
History and Physical Interval Note:  07/25/2020 8:28 AM  Pamela Douglas  has presented today for surgery, with the diagnosis of COLON CANCER SCREENING.  The various methods of treatment have been discussed with the patient and family. After consideration of risks, benefits and other options for treatment, the patient has consented to  Procedure(s) with comments: COLONOSCOPY WITH PROPOFOL (N/A) - COVID POSITIVE 05/23/2020 as a surgical intervention.  The patient's history has been reviewed, patient examined, no change in status, stable for surgery.  I have reviewed the patient's chart and labs.  Questions were answered to the patient's satisfaction.     Lesly Rubenstein  Ok to proceed with colonoscopy

## 2020-07-25 NOTE — H&P (Signed)
Outpatient short stay form Pre-procedure 07/25/2020 8:25 AM Raylene Miyamoto MD, MPH  Primary Physician: Dr. Leafy Ro  Reason for visit:  Polyp of colon  History of present illness:   50 y/o lady with history of colon polyps with biopsies of area of mucosa showing SSA. Here for colonoscopy for removal. History of vaginal hysterectomy. No family history of GI malignancies. No blood thinners.    Current Facility-Administered Medications:  .  0.9 %  sodium chloride infusion, , Intravenous, Continuous, Areesha Dehaven, Hilton Cork, MD, Last Rate: 20 mL/hr at 07/25/20 0809, New Bag at 07/25/20 0809  Medications Prior to Admission  Medication Sig Dispense Refill Last Dose  . carbamazepine (CARBATROL) 300 MG 12 hr capsule Take 300 mg by mouth 2 (two) times daily.   07/25/2020 at 0530  . lamoTRIgine (LAMICTAL) 200 MG tablet Take 200 mg by mouth 2 (two) times daily.   07/25/2020 at 0530  . levETIRAcetam (KEPPRA) 750 MG tablet Take 3,000 mg by mouth at bedtime.   07/24/2020 at [place]  . Multiple Vitamin (MULTIVITAMIN) tablet Take 1 tablet by mouth daily.   07/24/2020 at [place]  . benzonatate (TESSALON) 100 MG capsule Take 2 capsules (200 mg total) by mouth every 8 (eight) hours. (Patient not taking: Reported on 07/25/2020) 21 capsule 0 Completed Course at Unknown time  . fexofenadine-pseudoephedrine (ALLEGRA-D ALLERGY & CONGESTION) 180-240 MG 24 hr tablet Take 1 tablet by mouth daily. (Patient not taking: Reported on 02/29/2020) 30 tablet 0   . promethazine-dextromethorphan (PROMETHAZINE-DM) 6.25-15 MG/5ML syrup Take 5 mLs by mouth 4 (four) times daily as needed. (Patient not taking: Reported on 07/25/2020) 118 mL 0 Not Taking at Unknown time     Allergies  Allergen Reactions  . Sulfa Antibiotics Rash     Past Medical History:  Diagnosis Date  . 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 Epilepsy ([place])   . History of abnormal cervical Pap smear   . History of chlamydia   . Sleep apnea     Review of  systems:  Otherwise negative.    Physical Exam  Gen: Alert, oriented. Appears stated age.  HEENT: PERRLA. Lungs: No respiratory distress CV: RRR Abd: soft, benign, no masses Ext: No edema    Planned procedures: Proceed with colonoscopy. The patient understands the nature of the planned procedure, indications, risks, alternatives and potential complications including but not limited to bleeding, infection, perforation, damage to internal organs and possible oversedation/side effects from anesthesia. The patient agrees and gives consent to proceed.  Please refer to procedure notes for findings, recommendations and patient disposition/instructions.     Raylene Miyamoto MD, MPH Gastroenterology 07/25/2020  8:25 AM

## 2020-07-26 ENCOUNTER — Encounter: Payer: Self-pay | Admitting: Gastroenterology

## 2020-07-26 LAB — SURGICAL PATHOLOGY

## 2020-09-13 ENCOUNTER — Other Ambulatory Visit: Payer: Self-pay

## 2020-09-13 ENCOUNTER — Ambulatory Visit
Admission: RE | Admit: 2020-09-13 | Discharge: 2020-09-13 | Disposition: A | Discharge status: Home / Self Care | Payer: 59 | Source: Ambulatory Visit | Attending: Emergency Medicine | Admitting: Emergency Medicine

## 2020-09-13 VITALS — BP 132/89 | HR 70 | Temp 98.2°F | Resp 15 | Ht 64.0 in | Wt 155.0 lb

## 2020-09-13 DIAGNOSIS — H6982 Other specified disorders of Eustachian tube, left ear: Secondary | ICD-10-CM | POA: Diagnosis not present

## 2020-09-13 MED ORDER — METHYLPREDNISOLONE 4 MG PO TBPK: ORAL_TABLET | ORAL | 0 refills | Status: AC

## 2020-09-13 NOTE — ED Provider Notes (Signed)
[facility]    CSN: [id number] Arrival date & time: 09/13/20  1558      History   Chief Complaint Chief Complaint  Patient presents with  . Ear Fullness    left    HPI Pamela Douglas is a 50 y.o. female.   HPI   50 year old female here for evaluation of left ear fullness.  Patient reports that she has been experiencing fullness in her left ear along with intermittent dizziness and vertigo type symptoms for the last 4 days.  She is also had some intermittent ringing in her left ear.  She denies drainage, fever, runny nose, or nasal congestion.  Patient reports that she remembers using a Q-tip last week but does not member the cotton being there when she pulled the Q-tip out and is concerned she might have a foreign body in her left ear.  Past Medical History:  Diagnosis Date  . 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 Epilepsy ([place])   . History of abnormal cervical Pap smear   . History of chlamydia   . Sleep apnea     There are no problems to display for this patient.   Past Surgical History:  Procedure Laterality Date  . ABDOMINAL HYSTERECTOMY  2008  . COLONOSCOPY WITH PROPOFOL N/A 02/29/2020   Procedure: COLONOSCOPY WITH PROPOFOL;  Surgeon: Lesly Rubenstein, MD;  Location: [facility] ENDOSCOPY;  Service: Endoscopy;  Laterality: N/A;  . COLONOSCOPY WITH PROPOFOL N/A 07/25/2020   Procedure: COLONOSCOPY WITH PROPOFOL;  Surgeon: Lesly Rubenstein, MD;  Location: [facility] ENDOSCOPY;  Service: Endoscopy;  Laterality: N/A;  COVID POSITIVE 05/23/2020    OB History   No obstetric history on file.      Home Medications    Prior to Admission medications   Medication Sig Start Date End Date Taking? Authorizing Provider  carbamazepine (CARBATROL) 300 MG 12 hr capsule Take 300 mg by mouth 2 (two) times daily.   Yes [provider]  lamoTRIgine (LAMICTAL) 200 MG tablet Take 200 mg by mouth 2 (two) times daily.   Yes [provider]   levETIRAcetam (KEPPRA) 750 MG tablet Take 3,000 mg by mouth at bedtime.   Yes [provider]  methylPREDNISolone (MEDROL DOSEPAK) 4 MG TBPK tablet Take according to the package insert. 09/13/20  Yes Margarette Canada, NP  Multiple Vitamin (MULTIVITAMIN) tablet Take 1 tablet by mouth daily.   Yes [provider]    Family History Family History  Problem Relation Age of Onset  . Diabetes Mother   . Breast cancer Mother 1  . Cancer Father   . Lung cancer Father   . Diabetes Maternal Aunt   . Diabetes Maternal Uncle   . Diabetes Maternal Grandmother     Social History Social History   Tobacco Use  . Smoking status: Former Research scientist (life sciences)  . Smokeless tobacco: Never Used  Vaping Use  . Vaping Use: Never used  Substance Use Topics  . Alcohol use: Yes    Comment: occasionally  . Drug use: Not Currently     Allergies   Sulfa antibiotics   Review of Systems Review of Systems  Constitutional: Negative for activity change and appetite change.  HENT: Positive for ear pain and tinnitus. Negative for congestion and ear discharge.   Neurological: Positive for dizziness.     Physical Exam Triage Vital Signs ED Triage Vitals [09/13/20 1643]  Enc Vitals Group     BP      Pulse  Resp      Temp      Temp src      SpO2      Weight 155 lb (70.3 kg)     Height 5' 4" (1.626 m)     Head Circumference      Peak Flow      Pain Score 3     Pain Loc      Pain Edu?      Excl. in [place]?    No data found.  Updated Vital Signs BP 132/89 (BP Location: Right Arm)   Pulse 70   Temp 98.2 F (36.8 C) (Oral)   Resp 15   Ht 5' 4" (1.626 m)   Wt 155 lb (70.3 kg)   SpO2 99%   BMI 26.61 kg/m   Visual Acuity Right Eye Distance:   Left Eye Distance:   Bilateral Distance:    Right Eye Near:   Left Eye Near:    Bilateral Near:     Physical Exam Vitals and nursing note reviewed.  Constitutional:      General: She is not in acute distress.    Appearance: Normal  appearance. She is normal weight.  HENT:     Head: Normocephalic and atraumatic.     Right Ear: Ear canal and external ear normal. There is no impacted cerumen.     Left Ear: Ear canal and external ear normal. There is no impacted cerumen.  Skin:    General: Skin is warm and dry.     Capillary Refill: Capillary refill takes less than 2 seconds.     Findings: No erythema or rash.  Neurological:     General: No focal deficit present.     Mental Status: She is alert and oriented to person, place, and time.  Psychiatric:        Mood and Affect: Mood normal.        Behavior: Behavior normal.        Thought Content: Thought content normal.        Judgment: Judgment normal.      UC Treatments / Results  Labs (all labs ordered are listed, but only abnormal results are displayed) Labs Reviewed - No data to display  EKG   Radiology No results found.  Procedures Procedures (including critical care time)  Medications Ordered in UC Medications - No data to display  Initial Impression / Assessment and Plan / UC Course  I have reviewed the triage vital signs and the nursing notes.  Pertinent labs & imaging results that were available during my care of the patient were reviewed by me and considered in my medical decision making (see chart for details).   Patient is a very pleasant, nontoxic-appearing 50 year old female here for evaluation of left ear fullness that been going for the past 4 to 5 days and has been accompanied with intermittent vertigo type symptoms and tinnitus.  Patient was concerned that she might have a piece of cotton from a Q-tip lodged in her ear as she remembers using them last week but does not remember the cotton being there when she pulled the stick out.  Patient denies any upper respiratory symptoms or drainage from her ear.  She also denies fever.  Physical exam reveals clear external auditory canals bilaterally with pearly gray tympanic membranes and a normal  light reflex bilaterally.  There is a serous effusion behind each ear.  Patient also has tenderness when palpating the eustachian tube behind her left ear  externally.  Patient is able to equalize her ears in clinic but the left ear will not clear.  Patient's exam is consistent with eustachian tube dysfunction.  We will treat patient with Medrol Dosepak, sinus irrigation, heating pad, and Sudafed as needed.   Final Clinical Impressions(s) / UC Diagnoses   Final diagnoses:  Eustachian tube dysfunction, left     Discharge Instructions     Continue to try and equalize your ears and clear the blockage from your left eustachian tube.  Start the Medrol Dosepak in the morning and take according to the package instructions.  You will take it on a tapering dose over a period of 6 days.  Perform sinus irrigation with a [employer]Med sinus rinse kit and distilled water to help clear eustachian tube as well.  Do not use tap water.  Place a heating pad, or hot water bottle, underneath your pillowcase at night and lay on it to help dilate eustachian tube and facilitate drainage.  Return for reevaluation for any new or worsening symptoms.    ED Prescriptions    Medication Sig Dispense Auth. Provider   methylPREDNISolone (MEDROL DOSEPAK) 4 MG TBPK tablet Take according to the package insert. 1 each Margarette Canada, NP     PDMP not reviewed this encounter.   Margarette Canada, NP 09/13/20 [phone]

## 2020-09-13 NOTE — ED Triage Notes (Signed)
Patient complains of left ear fullness that started last week but has worsened over the last few days. States that she remembers using a qtip last week and doesn't remember the cotton being on there. States that she was having vertigo earlier in the week but that has resolved.

## 2020-09-13 NOTE — Discharge Instructions (Addendum)
Continue to try and equalize your ears and clear the blockage from your left eustachian tube.  Start the Medrol Dosepak in the morning and take according to the package instructions.  You will take it on a tapering dose over a period of 6 days.  Perform sinus irrigation with a NeilMed sinus rinse kit and distilled water to help clear eustachian tube as well.  Do not use tap water.  Place a heating pad, or hot water bottle, underneath your pillowcase at night and lay on it to help dilate eustachian tube and facilitate drainage.  Return for reevaluation for any new or worsening symptoms.

## 2022-02-01 ENCOUNTER — Other Ambulatory Visit: Payer: Self-pay | Admitting: Obstetrics

## 2022-02-01 DIAGNOSIS — N6341 Unspecified lump in right breast, subareolar: Secondary | ICD-10-CM

## 2022-02-14 ENCOUNTER — Ambulatory Visit
Admission: RE | Admit: 2022-02-14 | Discharge: 2022-02-14 | Disposition: A | Discharge status: Home / Self Care | Payer: 59 | Source: Ambulatory Visit | Attending: Obstetrics | Admitting: Obstetrics

## 2022-02-14 DIAGNOSIS — N6341 Unspecified lump in right breast, subareolar: Secondary | ICD-10-CM

## 2023-04-04 ENCOUNTER — Other Ambulatory Visit: Payer: Self-pay | Admitting: Obstetrics and Gynecology

## 2023-04-04 DIAGNOSIS — Z1231 Encounter for screening mammogram for malignant neoplasm of breast: Secondary | ICD-10-CM

## 2023-04-25 ENCOUNTER — Ambulatory Visit
Admission: RE | Admit: 2023-04-25 | Discharge: 2023-04-25 | Disposition: A | Discharge status: Home / Self Care | Payer: 59 | Source: Ambulatory Visit | Attending: Obstetrics and Gynecology | Admitting: Obstetrics and Gynecology

## 2023-04-25 DIAGNOSIS — Z1231 Encounter for screening mammogram for malignant neoplasm of breast: Secondary | ICD-10-CM | POA: Insufficient documentation

## 2023-05-07 ENCOUNTER — Other Ambulatory Visit: Payer: Self-pay | Admitting: Obstetrics and Gynecology

## 2023-05-07 DIAGNOSIS — R928 Other abnormal and inconclusive findings on diagnostic imaging of breast: Secondary | ICD-10-CM

## 2023-05-10 ENCOUNTER — Ambulatory Visit
Admission: RE | Admit: 2023-05-10 | Discharge: 2023-05-10 | Disposition: A | Discharge status: Home / Self Care | Payer: 59 | Source: Ambulatory Visit | Attending: Obstetrics and Gynecology | Admitting: Obstetrics and Gynecology

## 2023-05-10 DIAGNOSIS — R928 Other abnormal and inconclusive findings on diagnostic imaging of breast: Secondary | ICD-10-CM | POA: Diagnosis present
